# Patient Record
Sex: Female | Born: 1985 | Hispanic: No | Marital: Married | Smoking: Never smoker
Health system: Southern US, Community
[De-identification: ages and names within clinical notes are randomized; demographics above are authoritative.]

## PROBLEM LIST (undated history)

## (undated) DIAGNOSIS — F840 Autistic disorder: Secondary | ICD-10-CM

## (undated) DIAGNOSIS — F419 Anxiety disorder, unspecified: Secondary | ICD-10-CM

## (undated) DIAGNOSIS — F32A Depression, unspecified: Secondary | ICD-10-CM

## (undated) DIAGNOSIS — Z9889 Other specified postprocedural states: Secondary | ICD-10-CM

## (undated) DIAGNOSIS — R87629 Unspecified abnormal cytological findings in specimens from vagina: Secondary | ICD-10-CM

## (undated) DIAGNOSIS — IMO0001 Reserved for inherently not codable concepts without codable children: Secondary | ICD-10-CM

## (undated) DIAGNOSIS — N83209 Unspecified ovarian cyst, unspecified side: Secondary | ICD-10-CM

## (undated) DIAGNOSIS — F329 Major depressive disorder, single episode, unspecified: Secondary | ICD-10-CM

## (undated) DIAGNOSIS — T8859XA Other complications of anesthesia, initial encounter: Secondary | ICD-10-CM

## (undated) DIAGNOSIS — K219 Gastro-esophageal reflux disease without esophagitis: Secondary | ICD-10-CM

## (undated) DIAGNOSIS — R112 Nausea with vomiting, unspecified: Secondary | ICD-10-CM

## (undated) DIAGNOSIS — T4145XA Adverse effect of unspecified anesthetic, initial encounter: Secondary | ICD-10-CM

## (undated) DIAGNOSIS — J45909 Unspecified asthma, uncomplicated: Secondary | ICD-10-CM

## (undated) HISTORY — DX: Gastro-esophageal reflux disease without esophagitis: K21.9

## (undated) HISTORY — DX: Reserved for inherently not codable concepts without codable children: IMO0001

## (undated) HISTORY — PX: TONSILLECTOMY: SUR1361

## (undated) HISTORY — PX: COLPOSCOPY: SHX161

## (undated) HISTORY — DX: Depression, unspecified: F32.A

## (undated) HISTORY — DX: Anxiety disorder, unspecified: F41.9

## (undated) HISTORY — DX: Unspecified asthma, uncomplicated: J45.909

## (undated) HISTORY — PX: ABDOMINAL HYSTERECTOMY: SHX81

## (undated) HISTORY — PX: IMPLANTATION VAGAL NERVE STIMULATOR: SUR692

## (undated) HISTORY — DX: Autistic disorder: F84.0

## (undated) HISTORY — DX: Major depressive disorder, single episode, unspecified: F32.9

## (undated) HISTORY — DX: Unspecified ovarian cyst, unspecified side: N83.209

## (undated) HISTORY — DX: Unspecified abnormal cytological findings in specimens from vagina: R87.629

---

## 2012-07-03 ENCOUNTER — Encounter: Payer: Self-pay | Admitting: Obstetrics & Gynecology

## 2012-07-03 ENCOUNTER — Ambulatory Visit (INDEPENDENT_AMBULATORY_CARE_PROVIDER_SITE_OTHER): Payer: 59 | Admitting: Obstetrics & Gynecology

## 2012-07-03 VITALS — BP 114/77 | HR 109 | Ht 61.0 in | Wt 180.0 lb

## 2012-07-03 DIAGNOSIS — Z0000 Encounter for general adult medical examination without abnormal findings: Secondary | ICD-10-CM

## 2012-07-03 DIAGNOSIS — Z124 Encounter for screening for malignant neoplasm of cervix: Secondary | ICD-10-CM

## 2012-07-03 DIAGNOSIS — E282 Polycystic ovarian syndrome: Secondary | ICD-10-CM

## 2012-07-03 DIAGNOSIS — Z113 Encounter for screening for infections with a predominantly sexual mode of transmission: Secondary | ICD-10-CM

## 2012-07-03 NOTE — Progress Notes (Signed)
Subjective:    Jade Gates is a 26 y.o. female who presents for an annual exam. She has been trying to get pregnant since March but has been unsuccessful. She has not had a period since then either and no + on the ovulation prediction kits. She was told at [facility] that she probably has PCOS and she has been treated with 6 months of provera in the past for anovulation. She denies using Clomid/drugs to achieve pregnancy in the past. The patient is sexually active. GYN screening history: last pap: was normal. The patient wears seatbelts: yes. The patient participates in regular exercise: yes.( She has lost 10 # by working with the [employer] center at [facility]) Has the patient ever been transfused or tattooed?: yes. (tattoo) The patient reports that there is not domestic violence in her life.   Menstrual History: OB History    Grav Para Term Preterm Abortions TAB SAB Ect Mult Living   2 2 2  0 0 0 0 0 0 2      Menarche age: 26 Patient's last menstrual period was 01/23/2012.    The following portions of the patient's history were reviewed and updated as appropriate: allergies, current medications, past family history, past medical history, past social history, past surgical history and problem list.  Review of Systems A comprehensive review of systems was negative. She does report that a previous u/s showed "many cysts" around her ovaries. She reports unwanted hair growth. She says that her cholesterol and sugar are elevated but that her FP is following them. (they are decreasing as she loses weight)   Objective:    BP 114/77  Pulse 109  Ht 5\' 1"  (1.549 m)  Wt 81.647 kg (180 lb)  BMI 34.01 kg/m2  LMP 01/23/2012  General Appearance:    Alert, cooperative, no distress, appears stated age  Head:    Normocephalic, without obvious abnormality, atraumatic  Eyes:    PERRL, conjunctiva/corneas clear, EOM's intact, fundi    benign, both eyes  Ears:    Normal TM's and external ear canals,  both ears  Nose:   Nares normal, septum midline, mucosa normal, no drainage    or sinus tenderness  Throat:   Lips, mucosa, and tongue normal; teeth and gums normal  Neck:   Supple, symmetrical, trachea midline, no adenopathy;    thyroid:  no enlargement/tenderness/nodules; no carotid   bruit or JVD  Back:     Symmetric, no curvature, ROM normal, no CVA tenderness  Lungs:     Clear to auscultation bilaterally, respirations unlabored  Chest Wall:    No tenderness or deformity   Heart:    Regular rate and rhythm, S1 and S2 normal, no murmur, rub   or gallop  Breast Exam:    No tenderness, masses, or nipple abnormality  Abdomen:     Soft, non-tender, bowel sounds active all four quadrants,    no masses, no organomegaly, obese, shaved hair nearly all over  Genitalia:    Normal female without lesion, discharge or tenderness, NSSA, mobile, NT, no adnexal masses     Extremities:   Extremities normal, atraumatic, no cyanosis or edema  Pulses:   2+ and symmetric all extremities  Skin:   Skin color, texture, turgor normal, no rashes or lesions  Lymph nodes:   Cervical, supraclavicular, and axillary nodes normal  Neurologic:   CNII-XII intact, normal strength, sensation and reflexes    throughout  .    Assessment:    Healthy female exam.  Plan:     Pap smear. cyclic provera

## 2012-07-03 NOTE — Progress Notes (Signed)
Patient is here for GYN exam and has been trying to conceive unsuccessfully.

## 2012-07-08 ENCOUNTER — Telehealth: Payer: Self-pay | Admitting: *Deleted

## 2012-07-08 DIAGNOSIS — E282 Polycystic ovarian syndrome: Secondary | ICD-10-CM

## 2012-07-08 MED ORDER — MEDROXYPROGESTERONE ACETATE 10 MG PO TABS: 10.0000 mg | ORAL_TABLET | Freq: Every day | ORAL | Status: DC

## 2012-07-08 NOTE — Telephone Encounter (Signed)
Pt called and stated that her RX for Provera was not @ the pharmacy.  Dr Marice Potter had said her plan was to put pt on cyclic Provera and RX was now sent to [facility] for Provera 10x10.

## 2014-09-06 ENCOUNTER — Encounter: Payer: Self-pay | Admitting: Obstetrics & Gynecology

## 2018-07-28 ENCOUNTER — Ambulatory Visit (INDEPENDENT_AMBULATORY_CARE_PROVIDER_SITE_OTHER): Payer: BLUE CROSS/BLUE SHIELD | Admitting: Obstetrics & Gynecology

## 2018-07-28 ENCOUNTER — Encounter: Payer: Self-pay | Admitting: Obstetrics & Gynecology

## 2018-07-28 VITALS — BP 114/75 | HR 81 | Ht 61.0 in | Wt 179.0 lb

## 2018-07-28 DIAGNOSIS — Z23 Encounter for immunization: Secondary | ICD-10-CM

## 2018-07-28 DIAGNOSIS — N938 Other specified abnormal uterine and vaginal bleeding: Secondary | ICD-10-CM | POA: Diagnosis not present

## 2018-07-28 DIAGNOSIS — Z862 Personal history of diseases of the blood and blood-forming organs and certain disorders involving the immune mechanism: Secondary | ICD-10-CM | POA: Diagnosis not present

## 2018-07-28 DIAGNOSIS — R5383 Other fatigue: Secondary | ICD-10-CM

## 2018-07-28 NOTE — Progress Notes (Signed)
Pt c/o bleeding everyday   Pt states last pap smear was in 2015- normal results

## 2018-07-28 NOTE — Progress Notes (Signed)
Patient ID: Jade Gates, female   DOB: 07-02-1986, 32 y.o.   MRN: [id number]  Chief Complaint  Patient presents with  . Metrorrhagia    HPI Jade Gates is a 32 y.o. female married P3 (70, 46, 62 yo kids) here today with the issue of "constant bleeding". This started about 6 weeks ago. She started OCPs about 4 months ago (her Ob/Gyn PA at [facility]) for PMDD. She tried the brand Junel (1.5/30) versus generic but continued. The dose was then increased but the bleeding is still occurring, daily to QOD, spotting- a pad lasts all day. She is having severe cramping. She is taking IBU 400mg  prn. She reports that her PMDD is better with OCPs. However, when she has the spotting, she feels like her mood is worse again.  HPI  Past Medical History:  Diagnosis Date  . Anxiety   . Asthma   . Autism   . Depression   . Depression   . Ovarian cyst   . Reflux   . Vaginal Pap smear, abnormal     Past Surgical History:  Procedure Laterality Date  . COLPOSCOPY    . IMPLANTATION VAGAL NERVE STIMULATOR      Family History  Adopted: Yes    Social History Social History   Tobacco Use  . Smoking status: Never Smoker  . Smokeless tobacco: Never Used  Substance Use Topics  . Alcohol use: No  . Drug use: No    Allergies  Allergen Reactions  . Ciprofloxacin Shortness Of Breath  . Vortioxetine Other (See Comments)    Other  . Atorvastatin Other (See Comments), Nausea Only and Palpitations    Headache, fatigue   . Buspar [Buspirone] Palpitations    Current Outpatient Medications  Medication Sig Dispense Refill  . alprazolam (XANAX) 2 MG tablet Take by mouth.    . Brexpiprazole 4 MG TABS Take by mouth.    . desvenlafaxine (PRISTIQ) 50 MG 24 hr tablet Take 50 mg by mouth daily.    . meloxicam (MOBIC) 15 MG tablet Take by mouth.    . montelukast (SINGULAIR) 10 MG tablet Take by mouth.    . norethindrone-ethinyl estradiol (JUNEL FE,GILDESS FE,LOESTRIN FE) 1-20 MG-MCG tablet Take 1 tablet by  mouth daily.    Marland Kitchen omeprazole (PRILOSEC) 40 MG capsule Take 1 capsule (40 mg total) by mouth 1  times daily before meals.    . VENTOLIN HFA 108 (90 Base) MCG/ACT inhaler TAKE 2 PUFFS BY MOUTH EVERY 4 HOURS AS NEEDED  99  . zolpidem (AMBIEN) 10 MG tablet Take by mouth.    Marland Kitchen amitriptyline (ELAVIL) 50 MG tablet Take 50 mg by mouth at bedtime.    . gabapentin (NEURONTIN) 100 MG capsule Take 100 mg by mouth 2 (two) times daily.    Marland Kitchen iloperidone (FANAPT) 4 MG TABS Take 4 mg by mouth 2 (two) times daily.    . medroxyPROGESTERone (PROVERA) 10 MG tablet Take 1 tablet (10 mg total) by mouth daily. 10 tablet 3  . venlafaxine XR (EFFEXOR-XR) 37.5 MG 24 hr capsule Take 37.5 mg by mouth daily.    Marland Kitchen venlafaxine XR (EFFEXOR-XR) 75 MG 24 hr capsule Take 75 mg by mouth daily.     No current facility-administered medications for this visit.     Review of Systems Review of Systems TSH recently normal She had iron infusions last month She had a colonoscopy and endoscopy She gets Vitamin B12 injections monthly She had 2 vaginal deliveries and 1 cesarean section She  has had a BTL.  Blood pressure 114/75, pulse 81, height 5\' 1"  (1.549 m), weight 179 lb (81.2 kg).  Physical Exam Physical Exam  Breathing, conversing, and ambulating normally Well nourished, well hydrated White female, no apparent distress Cervix- no lesions, small amount of BRB Bimanual exam limited by body habitus, no gross abnormalities, moderate uterine mobility, minimal descensus  Data Reviewed   Assessment   irregular bleeding with OCPs Preventative care    Plan     Gyn u/s Annual at next visit TDAP today Vitamin D level Come back 2-3 weeks       Bryne Lindon C Enslee Bibbins 07/28/2018, 3:19 PM

## 2018-07-29 ENCOUNTER — Other Ambulatory Visit: Payer: Self-pay | Admitting: Obstetrics & Gynecology

## 2018-07-29 LAB — CBC
HEMATOCRIT: 41.2 % (ref 35.0–45.0)
HEMOGLOBIN: 13.7 g/dL (ref 11.7–15.5)
MCH: 29.3 pg (ref 27.0–33.0)
MCHC: 33.3 g/dL (ref 32.0–36.0)
MCV: 88 fL (ref 80.0–100.0)
MPV: 10.8 fL (ref 7.5–12.5)
Platelets: 298 10*3/uL (ref 140–400)
RBC: 4.68 10*6/uL (ref 3.80–5.10)
RDW: 15.8 % — AB (ref 11.0–15.0)
WBC: 9.6 10*3/uL (ref 3.8–10.8)

## 2018-07-29 LAB — VITAMIN D 25 HYDROXY (VIT D DEFICIENCY, FRACTURES): Vit D, 25-Hydroxy: 14 ng/mL — ABNORMAL LOW (ref 30–100)

## 2018-07-29 MED ORDER — VITAMIN D (ERGOCALCIFEROL) 1.25 MG (50000 UNIT) PO CAPS: 50000.0000 [IU] | ORAL_CAPSULE | ORAL | 0 refills | Status: DC

## 2018-07-29 NOTE — Progress Notes (Signed)
Vit D prescribed Patient notified via Mychart 

## 2018-07-30 ENCOUNTER — Ambulatory Visit (INDEPENDENT_AMBULATORY_CARE_PROVIDER_SITE_OTHER): Payer: BLUE CROSS/BLUE SHIELD

## 2018-07-30 DIAGNOSIS — N938 Other specified abnormal uterine and vaginal bleeding: Secondary | ICD-10-CM | POA: Diagnosis not present

## 2018-08-21 ENCOUNTER — Other Ambulatory Visit (HOSPITAL_COMMUNITY)
Admission: RE | Admit: 2018-08-21 | Discharge: 2018-08-21 | Disposition: A | Discharge status: Home / Self Care | Payer: BLUE CROSS/BLUE SHIELD | Source: Ambulatory Visit | Attending: Obstetrics & Gynecology | Admitting: Obstetrics & Gynecology

## 2018-08-21 ENCOUNTER — Encounter: Payer: Self-pay | Admitting: Obstetrics & Gynecology

## 2018-08-21 ENCOUNTER — Ambulatory Visit (INDEPENDENT_AMBULATORY_CARE_PROVIDER_SITE_OTHER): Payer: BLUE CROSS/BLUE SHIELD | Admitting: Obstetrics & Gynecology

## 2018-08-21 VITALS — BP 111/80 | HR 83 | Ht 61.0 in | Wt 183.0 lb

## 2018-08-21 DIAGNOSIS — Z01419 Encounter for gynecological examination (general) (routine) without abnormal findings: Secondary | ICD-10-CM | POA: Diagnosis present

## 2018-08-21 MED ORDER — MISOPROSTOL 200 MCG PO TABS: ORAL_TABLET | ORAL | 0 refills | Status: DC

## 2018-08-21 NOTE — Progress Notes (Signed)
Subjective:    Jade Gates is a 32 y.o. married P3 female who presents for an annual exam. She is still having irregular spotting. She is still taking OCPs.  The patient is not currently sexually active for about 6 month due to her psych meds decreasing her libido.GYN screening history: last pap: was normal. The patient wears seatbelts: yes. The patient participates in regular exercise: yes. Has the patient ever been transfused or tattooed?: yes. The patient reports that there is not domestic violence in her life.   Menstrual History: OB History    Gravida  3   Para  3   Term  0   Preterm  3   AB  0   Living  3     SAB  0   TAB  0   Ectopic  0   Multiple  0   Live Births              Menarche age: 49 No LMP recorded.    The following portions of the patient's history were reviewed and updated as appropriate: allergies, current medications, past family history, past medical history, past social history, past surgical history and problem list.  Review of Systems Pertinent items are noted in HPI.   TSH normal at another doc's office this year. Married for 15 years Homemaker Got flu vaccine recently Fh- adopted Husband had a vasectomy Has to use IUI for her pregnancy She sees a Engineer, civil (consulting) and gets iron infusions periodically    Objective:    BP 111/80   Pulse 83   Ht 5\' 1"  (1.549 m)   Wt 183 lb (83 kg)   BMI 34.58 kg/m   General Appearance:    Alert, cooperative, no distress, appears stated age  Head:    Normocephalic, without obvious abnormality, atraumatic  Eyes:    PERRL, conjunctiva/corneas clear, EOM's intact, fundi    benign, both eyes  Ears:    Normal TM's and external ear canals, both ears  Nose:   Nares normal, septum midline, mucosa normal, no drainage    or sinus tenderness  Throat:   Lips, mucosa, and tongue normal; teeth and gums normal  Neck:   Supple, symmetrical, trachea midline, no adenopathy;    thyroid:  no  enlargement/tenderness/nodules; no carotid   bruit or JVD  Back:     Symmetric, no curvature, ROM normal, no CVA tenderness  Lungs:     Clear to auscultation bilaterally, respirations unlabored  Chest Wall:    No tenderness or deformity   Heart:    Regular rate and rhythm, S1 and S2 normal, no murmur, rub   or gallop  Breast Exam:    No tenderness, masses, or nipple abnormality  Abdomen:     Soft, non-tender, bowel sounds active all four quadrants,    no masses, no organomegaly  Genitalia:    Normal female without lesion, discharge or tenderness, normal size and shape, anteverted, mobile, non-tender, normal adnexal exam      Extremities:   Extremities normal, atraumatic, no cyanosis or edema  Pulses:   2+ and symmetric all extremities  Skin:   Skin color, texture, turgor normal, no rashes or lesions  Lymph nodes:   Cervical, supraclavicular, and axillary nodes normal  Neurologic:   CNII-XII intact, normal strength, sensation and reflexes    throughout  .    Assessment:    Healthy female exam.   DUB, possible adenomyosis   Plan:     Thin prep Pap  smear. with cotesting Schedule EMBX, pretreat with cytotec Cervical cultures If EMBX negative, consider ablation

## 2018-08-21 NOTE — Progress Notes (Signed)
Last pap- 07/03/12- normal

## 2018-08-26 LAB — CYTOLOGY - PAP
CHLAMYDIA, DNA PROBE: NEGATIVE
Diagnosis: NEGATIVE
HPV: NOT DETECTED
Neisseria Gonorrhea: NEGATIVE

## 2018-09-08 ENCOUNTER — Other Ambulatory Visit: Payer: BLUE CROSS/BLUE SHIELD | Admitting: Obstetrics & Gynecology

## 2018-09-11 ENCOUNTER — Other Ambulatory Visit: Payer: BLUE CROSS/BLUE SHIELD | Admitting: Obstetrics & Gynecology

## 2018-09-18 ENCOUNTER — Ambulatory Visit (INDEPENDENT_AMBULATORY_CARE_PROVIDER_SITE_OTHER): Payer: BLUE CROSS/BLUE SHIELD | Admitting: Obstetrics & Gynecology

## 2018-09-18 ENCOUNTER — Encounter (HOSPITAL_COMMUNITY): Payer: Self-pay

## 2018-09-18 ENCOUNTER — Encounter: Payer: Self-pay | Admitting: Obstetrics & Gynecology

## 2018-09-18 VITALS — BP 109/73 | HR 97 | Resp 16 | Ht 61.0 in | Wt 190.0 lb

## 2018-09-18 DIAGNOSIS — Z98891 History of uterine scar from previous surgery: Secondary | ICD-10-CM | POA: Insufficient documentation

## 2018-09-18 DIAGNOSIS — Z3202 Encounter for pregnancy test, result negative: Secondary | ICD-10-CM

## 2018-09-18 DIAGNOSIS — R102 Pelvic and perineal pain: Secondary | ICD-10-CM | POA: Diagnosis not present

## 2018-09-18 DIAGNOSIS — F319 Bipolar disorder, unspecified: Secondary | ICD-10-CM | POA: Diagnosis not present

## 2018-09-18 DIAGNOSIS — F840 Autistic disorder: Secondary | ICD-10-CM | POA: Diagnosis not present

## 2018-09-18 DIAGNOSIS — G8929 Other chronic pain: Secondary | ICD-10-CM

## 2018-09-18 DIAGNOSIS — N939 Abnormal uterine and vaginal bleeding, unspecified: Secondary | ICD-10-CM | POA: Insufficient documentation

## 2018-09-18 NOTE — Addendum Note (Signed)
Addended by: Lavonia Drafts on: 09/18/2018 03:13 PM   Modules accepted: Level of Service

## 2018-09-18 NOTE — Patient Instructions (Signed)
Hysterectomy Information A hysterectomy is a surgery to remove your uterus. After surgery, you will no longer have periods. Also, you will not be able to get pregnant. Reasons for this surgery  You have bleeding that is not normal and keeps coming back.  You have lasting (chronic) lower belly (pelvic) pain.  You have a lasting infection.  The lining of your uterus grows outside your uterus.  The lining of your uterus grows in the muscle of your uterus.  Your uterus falls down into your vagina.  You have a growth in your uterus that causes problems.  You have cells that could turn into cancer (precancerous cells).  You have cancer of the uterus or cervix. Types There are 3 types of hysterectomies. Depending on the type, the surgery will:  Remove the top part of the uterus only.  Remove the uterus and the cervix.  Remove the uterus, cervix, and tissue that holds the uterus in place in the lower belly.  Ways a hysterectomy can be performed There are 5 ways this surgery can be performed.  A cut (incision) is made in the belly (abdomen). The uterus is taken out through the cut.  A cut is made in the vagina. The uterus is taken out through the cut.  Three or four cuts are made in the belly. A surgical device with a camera is put through one of the cuts. The uterus is cut into small pieces. The uterus is taken out through the cuts or the vagina.  Three or four cuts are made in the belly. A surgical device with a camera is put through one of the cuts. The uterus is taken out through the vagina.  Three or four cuts are made in the belly. A surgical device that is controlled by a computer makes a visual image. The device helps the surgeon control the surgical tools. The uterus is cut into small pieces. The pieces are taken out through the cuts or through the vagina.  What can I expect after the surgery?  You will be given pain medicine.  You will need help at home for 3-5 days  after surgery.  You will need to see your doctor in 2-4 weeks after surgery.  You may get hot flashes, have night sweats, and have trouble sleeping.  You may need to have Pap tests in the future if your surgery was related to cancer. Talk to your doctor. It is still good to have regular exams. This information is not intended to replace advice given to you by your health care provider. Make sure you discuss any questions you have with your health care provider. Document Released: 01/14/2012 Document Revised: 03/29/2016 Document Reviewed: 06/29/2013 Elsevier Interactive Patient Education  2018 Elsevier Inc. Total Laparoscopic Hysterectomy, Care After Refer to this sheet in the next few weeks. These instructions provide you with information on caring for yourself after your procedure. Your health care provider may also give you more specific instructions. Your treatment has been planned according to current medical practices, but problems sometimes occur. Call your health care provider if you have any problems or questions after your procedure. What can I expect after the procedure?  Pain and bruising at the incision sites. You will be given pain medicine to control it.  Menopausal symptoms such as hot flashes, night sweats, and insomnia if your ovaries were removed.  Sore throat from the breathing tube that was inserted during surgery. Follow these instructions at home:  Only take over-the-counter or   prescription medicines for pain, discomfort, or fever as directed by your health care provider.  Do not take aspirin. It can cause bleeding.  Do not drive when taking pain medicine.  Follow your health care provider's advice regarding diet, exercise, lifting, driving, and general activities.  Resume your usual diet as directed and allowed.  Get plenty of rest and sleep.  Do not douche, use tampons, or have sexual intercourse for at least 6 weeks, or until your health care provider gives  you permission.  Change your bandages (dressings) as directed by your health care provider.  Monitor your temperature and notify your health care provider of a fever.  Take showers instead of baths for 2-3 weeks.  Do not drink alcohol until your health care provider gives you permission.  If you develop constipation, you may take a mild laxative with your health care provider's permission. Bran foods may help with constipation problems. Drinking enough fluids to keep your urine clear or pale yellow may help as well.  Try to have someone home with you for 1-2 weeks to help around the house.  Keep all of your follow-up appointments as directed by your health care provider. Contact a health care provider if:  You have swelling, redness, or increasing pain around your incision sites.  You have pus coming from your incision.  You notice a bad smell coming from your incision.  Your incision breaks open.  You feel dizzy or lightheaded.  You have pain or bleeding when you urinate.  You have persistent diarrhea.  You have persistent nausea and vomiting.  You have abnormal vaginal discharge.  You have a rash.  You have any type of abnormal reaction or develop an allergy to your medicine.  You have poor pain control with your prescribed medicine. Get help right away if:  You have chest pain or shortness of breath.  You have severe abdominal pain that is not relieved with pain medicine.  You have pain or swelling in your legs. This information is not intended to replace advice given to you by your health care provider. Make sure you discuss any questions you have with your health care provider. Document Released: 08/12/2013 Document Revised: 03/29/2016 Document Reviewed: 05/12/2013 Elsevier Interactive Patient Education  2017 Elsevier Inc.  

## 2018-09-18 NOTE — Progress Notes (Signed)
History:  32 y.o. [id number] here today for further eval of AUB and pelvic pain. Pt is s/p SVD x2 and c/s x1. Pt has tired OCPs and the LnIUD with no relief of the pain. Pt bleeding has led to Iron infusions x2. She has never had a blood transfusion.   The following portions of the patient's history were reviewed and updated as appropriate: allergies, current medications, past family history, past medical history, past social history, past surgical history and problem list.  Review of Systems:  Pertinent items are noted in HPI.    Objective:  Physical Exam Blood pressure 109/73, pulse 97, resp. rate 16, height 5\' 1"  (7.035 m), weight 190 lb (86.2 kg), last menstrual period 08/21/2018.  CONSTITUTIONAL: Well-developed, well-nourished female in no acute distress.  HENT:  Normocephalic, atraumatic EYES: Conjunctivae and EOM are normal. No scleral icterus.  NECK: Normal range of motion SKIN: Skin is warm and dry. No rash noted. Not diaphoretic.No pallor. [place]: Alert and oriented to person, place, and time. Normal coordination.  Abd: soft, NT, ND. Well helaed low transverse incision GYN:  EGBUS: no lesions Vagina: no blood in vault Cervix: no lesion; no mucopurulent d/c Uterus: small, mobile; good decensus Adnexa: no masses; non tender   Labs and Imaging 07/30/2018 CLINICAL DATA:  Dysfunctional uterine bleeding  EXAM: ULTRASOUND PELVIS TRANSVAGINAL  TECHNIQUE: Transvaginal ultrasound examination of the pelvis was performed including evaluation of the uterus, ovaries, adnexal regions, and pelvic cul-de-sac.  COMPARISON:  None.  FINDINGS: Uterus  Measurements: 8.6 x 4.2 x 4.9 cm. Heterogeneous echotexture likely reflects adenomyosis  Endometrium  Thickness: 4 mm in thickness.  No focal abnormality visualized.  Right ovary  Measurements: 3.2 x 2.1 x 2.9 cm. Normal appearance/no adnexal mass.  Left ovary  Measurements: 2.8 x 1.6 x 3.0 cm. Normal appearance/no  adnexal mass.  Other findings:  No abnormal free fluid  IMPRESSION: Heterogeneous echotexture throughout the uterus without well-defined focal fibroid. This likely reflects adenomyosis.  Assessment & Plan:  Pt with chronic pelvic pain and AUB. Sx not resolved with  OCPs or LnIUD. Her endometrial stirp is 3mm which make s endo ca highly unlikely at here age. The [place] is suggestive of adenomyosis which is consistent with her sx. Pt requests definitive tx with a hyst. I have counseled her that an ablation on may cause increased pain.    Patient desires surgical management with [facility] with bilateral salpingectomy.  The risks of surgery were discussed in detail with the patient including but not limited to: bleeding which may require transfusion or reoperation; infection which may require prolonged hospitalization or re-hospitalization and antibiotic therapy; injury to bowel, bladder, ureters and major vessels or other surrounding organs; need for additional procedures including laparotomy; thromboembolic phenomenon, incisional problems and other postoperative or anesthesia complications.  Patient was told that the likelihood that her condition and symptoms will be treated effectively with this surgical management was very high; the postoperative expectations were also discussed in detail. The patient also understands the alternative treatment options which were discussed in full. All questions were answered.  She was told that she will be contacted by our surgical scheduler regarding the time and date of her surgery; routine preoperative instructions of having nothing to eat or drink after midnight on the day prior to surgery and also coming to the hospital 1 1/2 hours prior to her time of surgery were also emphasized.  She was told she may be called for a preoperative appointment about a week prior to  surgery and will be given further preoperative instructions at that visit. Printed patient education handouts  about the procedure were given to the patient to review at home.  Total face-to-face time with patient was 65min.  Greater than 50% was spent in counseling and coordination of care with the patient.   Bethzaida Boord L. Harraway-Smith, M.D., Cherlynn June

## 2018-09-19 HISTORY — PX: ANKLE RECONSTRUCTION: SHX1151

## 2018-09-22 ENCOUNTER — Telehealth: Payer: Self-pay | Admitting: *Deleted

## 2018-09-22 NOTE — Telephone Encounter (Addendum)
Pt left message stating that she is scheduled for surgery on 12/3 and wants to know if she can still have the surgery since she has a cast on her leg. Pt had an injury to her leg which required ligament repair surgery. The cast goes from her foot to just below the knee. The cast will remain on for 6-8 wks. She is not weight bearing at this time and is using a wheelchair. Pt states that her doctor (Dr. Nicki Reaper @ [facility]) said he has no problems with her having the surgery as planned. I advised pt that there are multiple factors to consider since she will not be able to physically move about after the surgery. Pt states she really wants to have the surgery before the end of the calendar year due to her insurance. I will send a message to Dr. Ihor Dow with this information. Once a response is received, pt will receive MyChart message. I also requested pt to obtain a formal letter from Dr. Nicki Reaper stating his clearance for pt to proceed with gynecology surgery. Pt voiced understanding of all information given.   11/19 Pt has sent a MyChart message stating that her orthopedic surgeon is going to fax a letter to our office stating that he has no problem with her having surgery on 12/3. She will be able to use a knee scooter and therefore be able to move about after surgery. Pt has been informed via MyChart message that Dr. Ihor Dow is on vacation until 11/25. She will be notified once a response is received.

## 2018-09-23 ENCOUNTER — Encounter: Payer: Self-pay | Admitting: *Deleted

## 2018-09-25 ENCOUNTER — Encounter: Payer: Self-pay | Admitting: *Deleted

## 2018-09-29 ENCOUNTER — Encounter (HOSPITAL_COMMUNITY): Payer: Self-pay

## 2018-09-29 ENCOUNTER — Telehealth: Payer: Self-pay | Admitting: Obstetrics & Gynecology

## 2018-09-29 NOTE — Telephone Encounter (Signed)
TC to pt. Her ortho doc sent a note that she cannot be placed in stirrups during the procedure. There was no answer so I left a msg for her to call [facility], [phone].  Her case will need to be delayed by 6 weeks.  clh-S

## 2018-10-01 ENCOUNTER — Other Ambulatory Visit (HOSPITAL_COMMUNITY): Payer: BLUE CROSS/BLUE SHIELD

## 2018-10-13 ENCOUNTER — Other Ambulatory Visit: Payer: Self-pay | Admitting: Obstetrics & Gynecology

## 2018-10-16 ENCOUNTER — Telehealth: Payer: Self-pay | Admitting: *Deleted

## 2018-10-16 MED ORDER — NORETHINDRONE ACET-ETHINYL EST 1.5-30 MG-MCG PO TABS: 1.0000 | ORAL_TABLET | Freq: Every day | ORAL | 1 refills | Status: DC

## 2018-10-16 NOTE — Telephone Encounter (Signed)
Pt called requesting a RF on her OCP's because her husband accidentally threw away her pack of pills.  She is having surgery by Dr Ihor Dow in Jan 2020.

## 2018-10-27 NOTE — Patient Instructions (Addendum)
Jade Gates  10/27/2018   Your procedure is scheduled on: 11-11-18   Report to [facility] Main  Entrance    Report to Admitting at 5:30  AM    Call this number if you have problems the morning of surgery [phone]     Remember: Do not eat food or drink liquids :After Midnight. NO SOLID FOOD AFTER MIDNIGHT THE NIGHT PRIOR TO SURGERY. NOTHING BY MOUTH EXCEPT CLEAR LIQUIDS. PLEASE FINISH ENSURE DRINK PER SURGEON ORDER WHICH NEEDS TO BE COMPLETED AT 4:30 AM. AFTER 4:30 AM, NOTHING UNTIL AFTER SURGERY.  BRUSH YOUR TEETH MORNING OF SURGERY AND RINSE YOUR MOUTH OUT, NO CHEWING GUM CANDY OR MINTS.     CLEAR LIQUID DIET   Foods Allowed                                                                     Foods Excluded  Coffee and tea, regular and decaf                             liquids that you cannot  Plain Jell-O in any flavor                                             see through such as: Fruit ices (not with fruit pulp)                                     milk, soups, orange juice  Iced Popsicles                                    All solid food Carbonated beverages, regular and diet                                    Cranberry, grape and apple juices Sports drinks like Gatorade Lightly seasoned clear broth or consume(fat free) Sugar, honey syrup  Sample Menu Breakfast                                Lunch                                     Supper Cranberry juice                    Beef broth                            Chicken broth Jell-O  Grape juice                           Apple juice Coffee or tea                        Jell-O                                      Popsicle                                                Coffee or tea                        Coffee or tea  _____________________________________________________________________     Take these medicines the morning of surgery with A SIP OF WATER:  None-Per pt's preference                               You may not have any metal on your body including hair pins and              piercings  Do not wear jewelry, make-up, lotions, powders or perfumes, deodorant             Do not wear nail polish.  Do not shave  48 hours prior to surgery.               Do not bring valuables to the hospital. [place].  Contacts, dentures or bridgework may not be worn into surgery.  Leave suitcase in the car. After surgery it may be brought to your room.      Special Instructions: N/A              Please read over the following fact sheets you were given: _____________________________________________________________________             [facility] - Preparing for Surgery Before surgery, you can play an important role.  Because skin is not sterile, your skin needs to be as free of germs as possible.  You can reduce the number of germs on your skin by washing with CHG (chlorahexidine gluconate) soap before surgery.  CHG is an antiseptic cleaner which kills germs and bonds with the skin to continue killing germs even after washing. Please DO NOT use if you have an allergy to CHG or antibacterial soaps.  If your skin becomes reddened/irritated stop using the CHG and inform your nurse when you arrive at Short Stay. Do not shave (including legs and underarms) for at least 48 hours prior to the first CHG shower.  You may shave your face/neck. Please follow these instructions carefully:  1.  Shower with CHG Soap the night before surgery and the  morning of Surgery.  2.  If you choose to wash your hair, wash your hair first as usual with your  normal  shampoo.  3.  After you shampoo, rinse your hair and body thoroughly to remove the  shampoo.  4.  Use CHG as you would any other liquid soap.  You can apply chg directly  to the skin and wash                       Gently with a scrungie or  clean washcloth.  5.  Apply the CHG Soap to your body ONLY FROM THE NECK DOWN.   Do not use on face/ open                           Wound or open sores. Avoid contact with eyes, ears mouth and genitals (private parts).                       Wash face,  Genitals (private parts) with your normal soap.             6.  Wash thoroughly, paying special attention to the area where your surgery  will be performed.  7.  Thoroughly rinse your body with warm water from the neck down.  8.  DO NOT shower/wash with your normal soap after using and rinsing off  the CHG Soap.                9.  Pat yourself dry with a clean towel.            10.  Wear clean pajamas.            11.  Place clean sheets on your bed the night of your first shower and do not  sleep with pets. Day of Surgery : Do not apply any lotions/deodorants the morning of surgery.  Please wear clean clothes to the hospital/surgery center.  FAILURE TO FOLLOW THESE INSTRUCTIONS MAY RESULT IN THE CANCELLATION OF YOUR SURGERY PATIENT SIGNATURE_________________________________  NURSE SIGNATURE__________________________________  ________________________________________________________________________  WHAT IS A BLOOD TRANSFUSION? Blood Transfusion Information  A transfusion is the replacement of blood or some of its parts. Blood is made up of multiple cells which provide different functions.  Red blood cells carry oxygen and are used for blood loss replacement.  White blood cells fight against infection.  Platelets control bleeding.  Plasma helps clot blood.  Other blood products are available for specialized needs, such as hemophilia or other clotting disorders. BEFORE THE TRANSFUSION  Who gives blood for transfusions?   Healthy volunteers who are fully evaluated to make sure their blood is safe. This is blood bank blood. Transfusion therapy is the safest it has ever been in the practice of medicine. Before blood is taken from a  donor, a complete history is taken to make sure that person has no history of diseases nor engages in risky social behavior (examples are intravenous drug use or sexual activity with multiple partners). The donor's travel history is screened to minimize risk of transmitting infections, such as malaria. The donated blood is tested for signs of infectious diseases, such as HIV and hepatitis. The blood is then tested to be sure it is compatible with you in order to minimize the chance of a transfusion reaction. If you or a relative donates blood, this is often done in anticipation of surgery and is not appropriate for emergency situations. It takes many days to process the donated blood. RISKS AND COMPLICATIONS Although transfusion therapy is very safe and saves many lives, the main dangers of transfusion include:   Getting an infectious disease.  Developing a transfusion reaction.  This is an allergic reaction to something in the blood you were given. Every precaution is taken to prevent this. The decision to have a blood transfusion has been considered carefully by your caregiver before blood is given. Blood is not given unless the benefits outweigh the risks. AFTER THE TRANSFUSION  Right after receiving a blood transfusion, you will usually feel much better and more energetic. This is especially true if your red blood cells have gotten low (anemic). The transfusion raises the level of the red blood cells which carry oxygen, and this usually causes an energy increase.  The nurse administering the transfusion will monitor you carefully for complications. HOME CARE INSTRUCTIONS  No special instructions are needed after a transfusion. You may find your energy is better. Speak with your caregiver about any limitations on activity for underlying diseases you may have. SEEK MEDICAL CARE IF:   Your condition is not improving after your transfusion.  You develop redness or irritation at the intravenous (IV)  site. SEEK IMMEDIATE MEDICAL CARE IF:  Any of the following symptoms occur over the next 12 hours:  Shaking chills.  You have a temperature by mouth above 102 F (38.9 C), not controlled by medicine.  Chest, back, or muscle pain.  People around you feel you are not acting correctly or are confused.  Shortness of breath or difficulty breathing.  Dizziness and fainting.  You get a rash or develop hives.  You have a decrease in urine output.  Your urine turns a dark color or changes to pink, red, or brown. Any of the following symptoms occur over the next 10 days:  You have a temperature by mouth above 102 F (38.9 C), not controlled by medicine.  Shortness of breath.  Weakness after normal activity.  The white part of the eye turns yellow (jaundice).  You have a decrease in the amount of urine or are urinating less often.  Your urine turns a dark color or changes to pink, red, or brown. Document Released: 10/19/2000 Document Revised: 01/14/2012 Document Reviewed: 06/07/2008 [facility] Patient Information 2014 [place], [place].  _______________________________________________________________________

## 2018-11-04 ENCOUNTER — Encounter (HOSPITAL_COMMUNITY): Payer: Self-pay

## 2018-11-04 ENCOUNTER — Other Ambulatory Visit: Payer: Self-pay

## 2018-11-04 ENCOUNTER — Encounter (HOSPITAL_COMMUNITY)
Admission: RE | Admit: 2018-11-04 | Discharge: 2018-11-04 | Disposition: A | Discharge status: Home / Self Care | Payer: BLUE CROSS/BLUE SHIELD | Source: Ambulatory Visit | Attending: Obstetrics & Gynecology | Admitting: Obstetrics & Gynecology

## 2018-11-04 DIAGNOSIS — Z01812 Encounter for preprocedural laboratory examination: Secondary | ICD-10-CM | POA: Insufficient documentation

## 2018-11-04 HISTORY — DX: Other complications of anesthesia, initial encounter: T88.59XA

## 2018-11-04 HISTORY — DX: Nausea with vomiting, unspecified: R11.2

## 2018-11-04 HISTORY — DX: Adverse effect of unspecified anesthetic, initial encounter: T41.45XA

## 2018-11-04 HISTORY — DX: Other specified postprocedural states: Z98.890

## 2018-11-04 LAB — CBC
HEMATOCRIT: 39.6 % (ref 36.0–46.0)
HEMOGLOBIN: 12.8 g/dL (ref 12.0–15.0)
MCH: 31 pg (ref 26.0–34.0)
MCHC: 32.3 g/dL (ref 30.0–36.0)
MCV: 95.9 fL (ref 80.0–100.0)
Platelets: 296 10*3/uL (ref 150–400)
RBC: 4.13 MIL/uL (ref 3.87–5.11)
RDW: 11.8 % (ref 11.5–15.5)
WBC: 6.9 10*3/uL (ref 4.0–10.5)
nRBC: 0 % (ref 0.0–0.2)

## 2018-11-04 LAB — HCG, SERUM, QUALITATIVE: PREG SERUM: NEGATIVE

## 2018-11-04 NOTE — Progress Notes (Signed)
Pt indicated that she "always" takes her medication when she has ECT,after the procedure. As a results, she prefers not to take any of her medication (including the antidepressant/antipsychotic medication) until after her surgical procedure .Marland KitchenMarland KitchenPt stated that she was unable to urinate to provide a urine sample. Therefore, serum pregnancy test ordered.

## 2018-11-05 ENCOUNTER — Other Ambulatory Visit: Payer: Self-pay | Admitting: Obstetrics & Gynecology

## 2018-11-05 LAB — ABO/RH: ABO/RH(D): A NEG

## 2018-11-10 NOTE — H&P (Signed)
Preoperative History and Physical  Jade Gates is a 33 y.o. [phone] here for surgical management of AUB and chronic pelvic pain not managed well with conservative measures- suspected adenomyosis.   Proposed surgery: Robot assisted total laparoscopic hysterectomy with bilateral salpingectomy  Past Medical History:  Diagnosis Date  . Anxiety   . Asthma   . Autism   . Complication of anesthesia   . Depression   . Depression   . Ovarian cyst   . PONV (postoperative nausea and vomiting)   . Reflux   . Vaginal Pap smear, abnormal    Past Surgical History:  Procedure Laterality Date  . ANKLE RECONSTRUCTION Left 09/19/2018  . COLPOSCOPY    . IMPLANTATION VAGAL NERVE STIMULATOR    . TONSILLECTOMY     OB History    Gravida  3   Para  3   Term  0   Preterm  3   AB  0   Living  3     SAB  0   TAB  0   Ectopic  0   Multiple  0   Live Births             Patient denies any cervical dysplasia or STIs. No medications prior to admission.    Allergies  Allergen Reactions  . Ciprofloxacin Shortness Of Breath  . Vortioxetine Other (See Comments)    Other  . Atorvastatin Other (See Comments), Nausea Only and Palpitations    Headache, fatigue   . Buspar [Buspirone] Palpitations   Social History:   reports that she has never smoked. She has never used smokeless tobacco. She reports that she does not drink alcohol or use drugs. Family History  Adopted: Yes    Review of Systems: Noncontributory  PHYSICAL EXAM: There were no vitals taken for this visit. General appearance - alert, well appearing, and in no distress Chest - clear to auscultation, no wheezes, rales or rhonchi, symmetric air entry Heart - normal rate and regular rhythm Abdomen - soft, nontender, nondistended, no masses or organomegaly Pelvic - examination not indicated Extremities - peripheral pulses normal, no pedal edema, no clubbing or cyanosis  Labs: Results for orders placed or performed  during the hospital encounter of 11/04/18 (from the past 336 hour(s))  ABO/Rh   Collection Time: 11/04/18  3:47 PM  Result Value Ref Range   ABO/RH(D)      A NEG Performed at [facility] [address], [place], [place] [address]   CBC   Collection Time: 11/04/18  3:48 PM  Result Value Ref Range   WBC 6.9 4.0 - 10.5 K/uL   RBC 4.13 3.87 - 5.11 MIL/uL   Hemoglobin 12.8 12.0 - 15.0 g/dL   HCT 39.6 36.0 - 46.0 %   MCV 95.9 80.0 - 100.0 fL   MCH 31.0 26.0 - 34.0 pg   MCHC 32.3 30.0 - 36.0 g/dL   RDW 11.8 11.5 - 15.5 %   Platelets 296 150 - 400 K/uL   nRBC 0.0 0.0 - 0.2 %  hCG, serum, qualitative (Not at [facility])   Collection Time: 11/04/18  3:48 PM  Result Value Ref Range   Preg, Serum NEGATIVE NEGATIVE  Type and screen [place]   Collection Time: 11/04/18  3:48 PM  Result Value Ref Range   ABO/RH(D) A NEG    Antibody Screen NEG    Sample Expiration 11/18/2018    Extend sample reason      NO TRANSFUSIONS  OR PREGNANCY IN THE PAST 3 MONTHS Performed at [facility] [address], [place], [place] [address]     Imaging Studies: 07/30/2018 CLINICAL DATA:  Dysfunctional uterine bleeding  EXAM: ULTRASOUND PELVIS TRANSVAGINAL  TECHNIQUE: Transvaginal ultrasound examination of the pelvis was performed including evaluation of the uterus, ovaries, adnexal regions, and pelvic cul-de-sac.  COMPARISON:  None.  FINDINGS: Uterus  Measurements: 8.6 x 4.2 x 4.9 cm. Heterogeneous echotexture likely reflects adenomyosis  Endometrium  Thickness: 4 mm in thickness.  No focal abnormality visualized.  Right ovary  Measurements: 3.2 x 2.1 x 2.9 cm. Normal appearance/no adnexal mass.  Left ovary  Measurements: 2.8 x 1.6 x 3.0 cm. Normal appearance/no adnexal mass.  Other findings:  No abnormal free fluid  IMPRESSION: Heterogeneous echotexture throughout the uterus without well-defined focal fibroid. This  likely reflects adenomyosis.  Assessment: Patient Active Problem List   Diagnosis Date Noted  . Chronic female pelvic pain 09/18/2018  . Abnormal uterine bleeding (AUB) 09/18/2018  . Previous cesarean section 09/18/2018  . Bipolar disease, chronic ([place]) 09/18/2018  . Autism disorder 09/18/2018    Plan: Patient will undergo surgical management with Robot assisted total laparoscopic hysterectomy with bilateral salpingectomy.   The risks of surgery were discussed in detail with the patient including but not limited to: bleeding which may require transfusion or reoperation; infection which may require antibiotics; injury to surrounding organs which may involve bowel, bladder, ureters ; need for additional procedures including laparoscopy or laparotomy; thromboembolic phenomenon, surgical site problems and other postoperative/anesthesia complications. Likelihood of success in alleviating the patient's condition was discussed. Routine postoperative instructions will be reviewed with the patient and her family in detail after surgery.  The patient concurred with the proposed plan, giving informed written consent for the surgery.  Patient has been NPO since last night she will remain NPO for procedure.  Anesthesia and OR aware.  Preoperative prophylactic antibiotics and SCDs ordered on call to the OR.  To OR when ready.  Daijah Scrivens L. Ihor Dow, M.D., [facility] 11/10/2018 4:41 PM

## 2018-11-10 NOTE — Anesthesia Preprocedure Evaluation (Addendum)
Anesthesia Evaluation  Patient identified by MRN, date of birth, ID band Patient awake    Reviewed: Allergy & Precautions, NPO status , Patient's Chart, lab work & pertinent test results  History of Anesthesia Complications (+) PONV and history of anesthetic complications  Airway Mallampati: III  TM Distance: >3 FB Neck ROM: Full    Dental no notable dental hx. (+) Dental Advisory Given   Pulmonary asthma ,    Pulmonary exam normal        Cardiovascular negative cardio ROS Normal cardiovascular exam     Neuro/Psych PSYCHIATRIC DISORDERS Anxiety Depression Bipolar Disorder negative neurological ROS     GI/Hepatic negative GI ROS, Neg liver ROS,   Endo/Other  Morbid obesity  Renal/GU negative Renal ROS     Musculoskeletal negative musculoskeletal ROS (+)   Abdominal   Peds negative pediatric ROS (+)  Hematology negative hematology ROS (+)   Anesthesia Other Findings Day of surgery medications reviewed with the patient.  Reproductive/Obstetrics                            Anesthesia Physical Anesthesia Plan  ASA: II  Anesthesia Plan: General   Post-op Pain Management:    Induction: Intravenous  PONV Risk Score and Plan: 4 or greater and Ondansetron, Dexamethasone, Scopolamine patch - Pre-op and Diphenhydramine  Airway Management Planned: Oral ETT  Additional Equipment:   Intra-op Plan:   Post-operative Plan: Extubation in OR  Informed Consent:   Plan Discussed with:   Anesthesia Plan Comments:         Anesthesia Quick Evaluation

## 2018-11-11 ENCOUNTER — Ambulatory Visit (HOSPITAL_BASED_OUTPATIENT_CLINIC_OR_DEPARTMENT_OTHER)
Admission: RE | Admit: 2018-11-11 | Discharge: 2018-11-11 | Disposition: A | Discharge status: Home / Self Care | Payer: BLUE CROSS/BLUE SHIELD | Source: Ambulatory Visit | Attending: Obstetrics & Gynecology | Admitting: Obstetrics & Gynecology

## 2018-11-11 ENCOUNTER — Encounter (HOSPITAL_COMMUNITY)
Admission: RE | Disposition: A | Discharge status: Home / Self Care | Payer: Self-pay | Source: Ambulatory Visit | Attending: Obstetrics & Gynecology

## 2018-11-11 ENCOUNTER — Encounter (HOSPITAL_COMMUNITY): Payer: Self-pay | Admitting: *Deleted

## 2018-11-11 ENCOUNTER — Observation Stay (HOSPITAL_COMMUNITY): Payer: BLUE CROSS/BLUE SHIELD | Admitting: Physician Assistant

## 2018-11-11 ENCOUNTER — Observation Stay (HOSPITAL_BASED_OUTPATIENT_CLINIC_OR_DEPARTMENT_OTHER): Payer: BLUE CROSS/BLUE SHIELD | Admitting: Physician Assistant

## 2018-11-11 DIAGNOSIS — D259 Leiomyoma of uterus, unspecified: Secondary | ICD-10-CM | POA: Insufficient documentation

## 2018-11-11 DIAGNOSIS — F840 Autistic disorder: Secondary | ICD-10-CM | POA: Diagnosis not present

## 2018-11-11 DIAGNOSIS — R102 Pelvic and perineal pain: Secondary | ICD-10-CM | POA: Diagnosis present

## 2018-11-11 DIAGNOSIS — Z79899 Other long term (current) drug therapy: Secondary | ICD-10-CM | POA: Insufficient documentation

## 2018-11-11 DIAGNOSIS — N938 Other specified abnormal uterine and vaginal bleeding: Secondary | ICD-10-CM | POA: Insufficient documentation

## 2018-11-11 DIAGNOSIS — F319 Bipolar disorder, unspecified: Secondary | ICD-10-CM | POA: Insufficient documentation

## 2018-11-11 DIAGNOSIS — F419 Anxiety disorder, unspecified: Secondary | ICD-10-CM | POA: Insufficient documentation

## 2018-11-11 DIAGNOSIS — G8929 Other chronic pain: Secondary | ICD-10-CM | POA: Diagnosis present

## 2018-11-11 DIAGNOSIS — N939 Abnormal uterine and vaginal bleeding, unspecified: Secondary | ICD-10-CM

## 2018-11-11 DIAGNOSIS — J45909 Unspecified asthma, uncomplicated: Secondary | ICD-10-CM | POA: Diagnosis not present

## 2018-11-11 DIAGNOSIS — Z98891 History of uterine scar from previous surgery: Secondary | ICD-10-CM

## 2018-11-11 DIAGNOSIS — K219 Gastro-esophageal reflux disease without esophagitis: Secondary | ICD-10-CM | POA: Insufficient documentation

## 2018-11-11 DIAGNOSIS — Z9889 Other specified postprocedural states: Secondary | ICD-10-CM

## 2018-11-11 LAB — CBC
HCT: 40.2 % (ref 36.0–46.0)
Hemoglobin: 13 g/dL (ref 12.0–15.0)
MCH: 31 pg (ref 26.0–34.0)
MCHC: 32.3 g/dL (ref 30.0–36.0)
MCV: 95.7 fL (ref 80.0–100.0)
Platelets: 276 10*3/uL (ref 150–400)
RBC: 4.2 MIL/uL (ref 3.87–5.11)
RDW: 11.9 % (ref 11.5–15.5)
WBC: 12.2 10*3/uL — ABNORMAL HIGH (ref 4.0–10.5)
nRBC: 0 % (ref 0.0–0.2)

## 2018-11-11 LAB — CREATININE, SERUM
Creatinine, Ser: 0.68 mg/dL (ref 0.44–1.00)
GFR calc Af Amer: >60 mL/min (ref >60)
GFR calc non Af Amer: >60 mL/min (ref >60)

## 2018-11-11 LAB — TYPE AND SCREEN
ABO/RH(D): A NEG
Antibody Screen: NEGATIVE

## 2018-11-11 SURGERY — XI ROBOTIC ASSISTED TOTAL HYSTERECTOMY WITH SALPINGECTOMY
Anesthesia: General | Laterality: Bilateral

## 2018-11-11 MED ORDER — DIPHENHYDRAMINE HCL 50 MG/ML IJ SOLN
INTRAMUSCULAR | Status: DC | PRN
  Administered 2018-11-11: 12.5 mg via INTRAVENOUS

## 2018-11-11 MED ORDER — PROMETHAZINE HCL 25 MG/ML IJ SOLN: 6.2500 mg | INTRAMUSCULAR | Status: DC | PRN

## 2018-11-11 MED ORDER — SUGAMMADEX SODIUM 200 MG/2ML IV SOLN
INTRAVENOUS | Status: DC | PRN
  Administered 2018-11-11: 300 mg via INTRAVENOUS

## 2018-11-11 MED ORDER — SUGAMMADEX SODIUM 200 MG/2ML IV SOLN
INTRAVENOUS | Status: AC
  Filled 2018-11-11: qty 2

## 2018-11-11 MED ORDER — SIMETHICONE 80 MG PO CHEW: 80.0000 mg | CHEWABLE_TABLET | Freq: Four times a day (QID) | ORAL | Status: DC | PRN

## 2018-11-11 MED ORDER — LIDOCAINE 2% (20 MG/ML) 5 ML SYRINGE
INTRAMUSCULAR | Status: AC
  Filled 2018-11-11: qty 10

## 2018-11-11 MED ORDER — CEFAZOLIN SODIUM-DEXTROSE 2-4 GM/100ML-% IV SOLN
2.0000 g | INTRAVENOUS | Status: AC
  Administered 2018-11-11: 2 g via INTRAVENOUS
  Filled 2018-11-11: qty 100

## 2018-11-11 MED ORDER — DESVENLAFAXINE SUCCINATE ER 100 MG PO TB24: 100.0000 mg | ORAL_TABLET | Freq: Every day | ORAL | 0 refills | Status: AC

## 2018-11-11 MED ORDER — ENOXAPARIN SODIUM 40 MG/0.4ML ~~LOC~~ SOLN: 40.0000 mg | SUBCUTANEOUS | Status: DC

## 2018-11-11 MED ORDER — OXYCODONE-ACETAMINOPHEN 5-325 MG PO TABS: 1.0000 | ORAL_TABLET | Freq: Four times a day (QID) | ORAL | 0 refills | Status: DC | PRN

## 2018-11-11 MED ORDER — PHENYLEPHRINE 40 MCG/ML (10ML) SYRINGE FOR IV PUSH (FOR BLOOD PRESSURE SUPPORT)
PREFILLED_SYRINGE | INTRAVENOUS | Status: DC | PRN
  Administered 2018-11-11: 80 ug via INTRAVENOUS

## 2018-11-11 MED ORDER — ONDANSETRON HCL 4 MG PO TABS: 4.0000 mg | ORAL_TABLET | Freq: Four times a day (QID) | ORAL | Status: DC | PRN

## 2018-11-11 MED ORDER — SODIUM CHLORIDE 0.9 % IR SOLN
Status: DC | PRN
  Administered 2018-11-11: 1000 mL

## 2018-11-11 MED ORDER — ONDANSETRON HCL 4 MG/2ML IJ SOLN: 4.0000 mg | Freq: Four times a day (QID) | INTRAMUSCULAR | Status: DC | PRN

## 2018-11-11 MED ORDER — DEXAMETHASONE SODIUM PHOSPHATE 10 MG/ML IJ SOLN
INTRAMUSCULAR | Status: DC | PRN
  Administered 2018-11-11: 10 mg via INTRAVENOUS

## 2018-11-11 MED ORDER — SODIUM CHLORIDE (PF) 0.9 % IJ SOLN
INTRAMUSCULAR | Status: AC
  Filled 2018-11-11: qty 10

## 2018-11-11 MED ORDER — ONDANSETRON HCL 4 MG/2ML IJ SOLN
INTRAMUSCULAR | Status: AC
  Filled 2018-11-11: qty 2

## 2018-11-11 MED ORDER — SCOPOLAMINE 1 MG/3DAYS TD PT72
MEDICATED_PATCH | TRANSDERMAL | Status: AC
  Administered 2018-11-11: 1.5 mg
  Filled 2018-11-11: qty 1

## 2018-11-11 MED ORDER — MONTELUKAST SODIUM 10 MG PO TABS: 10.0000 mg | ORAL_TABLET | Freq: Every day | ORAL | Status: DC

## 2018-11-11 MED ORDER — BUPIVACAINE HCL (PF) 0.5 % IJ SOLN
INTRAMUSCULAR | Status: AC
  Filled 2018-11-11: qty 30

## 2018-11-11 MED ORDER — MIDAZOLAM HCL 2 MG/2ML IJ SOLN
INTRAMUSCULAR | Status: AC
  Filled 2018-11-11: qty 2

## 2018-11-11 MED ORDER — PHENYLEPHRINE 40 MCG/ML (10ML) SYRINGE FOR IV PUSH (FOR BLOOD PRESSURE SUPPORT)
PREFILLED_SYRINGE | INTRAVENOUS | Status: AC
  Filled 2018-11-11: qty 10

## 2018-11-11 MED ORDER — IBUPROFEN 800 MG PO TABS: 800.0000 mg | ORAL_TABLET | Freq: Three times a day (TID) | ORAL | 1 refills | Status: DC | PRN

## 2018-11-11 MED ORDER — BUPIVACAINE HCL (PF) 0.5 % IJ SOLN
INTRAMUSCULAR | Status: DC | PRN
  Administered 2018-11-11: 30 mL

## 2018-11-11 MED ORDER — CARIPRAZINE HCL 1.5 MG PO CAPS
1.5000 mg | ORAL_CAPSULE | Freq: Every day | ORAL | Status: DC
  Administered 2018-11-11: 1.5 mg via ORAL
  Filled 2018-11-11: qty 1

## 2018-11-11 MED ORDER — SCOPOLAMINE 1 MG/3DAYS TD PT72
MEDICATED_PATCH | TRANSDERMAL | Status: DC | PRN
  Administered 2018-11-11: 1 via TRANSDERMAL

## 2018-11-11 MED ORDER — ZOLPIDEM TARTRATE 5 MG PO TABS: 5.0000 mg | ORAL_TABLET | Freq: Every evening | ORAL | Status: DC | PRN

## 2018-11-11 MED ORDER — KETOROLAC TROMETHAMINE 30 MG/ML IJ SOLN
30.0000 mg | Freq: Four times a day (QID) | INTRAMUSCULAR | Status: DC
  Administered 2018-11-11: 30 mg via INTRAVENOUS
  Filled 2018-11-11: qty 1

## 2018-11-11 MED ORDER — FENTANYL CITRATE (PF) 250 MCG/5ML IJ SOLN
INTRAMUSCULAR | Status: AC
  Filled 2018-11-11: qty 5

## 2018-11-11 MED ORDER — DIPHENHYDRAMINE HCL 50 MG/ML IJ SOLN
INTRAMUSCULAR | Status: AC
  Filled 2018-11-11: qty 1

## 2018-11-11 MED ORDER — ROCURONIUM BROMIDE 10 MG/ML (PF) SYRINGE
PREFILLED_SYRINGE | INTRAVENOUS | Status: AC
  Filled 2018-11-11: qty 10

## 2018-11-11 MED ORDER — FENTANYL CITRATE (PF) 100 MCG/2ML IJ SOLN: 25.0000 ug | INTRAMUSCULAR | Status: DC | PRN

## 2018-11-11 MED ORDER — ZOLPIDEM TARTRATE 10 MG PO TABS: 10.0000 mg | ORAL_TABLET | Freq: Every day | ORAL | 0 refills | Status: AC

## 2018-11-11 MED ORDER — ARTIFICIAL TEARS OPHTHALMIC OINT
TOPICAL_OINTMENT | OPHTHALMIC | Status: AC
  Filled 2018-11-11: qty 3.5

## 2018-11-11 MED ORDER — BREXPIPRAZOLE 2 MG PO TABS
4.0000 mg | ORAL_TABLET | Freq: Every day | ORAL | Status: DC
  Administered 2018-11-11: 4 mg via ORAL
  Filled 2018-11-11: qty 2

## 2018-11-11 MED ORDER — HYDROMORPHONE HCL 1 MG/ML IJ SOLN: 0.2000 mg | INTRAMUSCULAR | Status: DC | PRN

## 2018-11-11 MED ORDER — ALBUTEROL SULFATE (2.5 MG/3ML) 0.083% IN NEBU: 3.0000 mL | INHALATION_SOLUTION | Freq: Four times a day (QID) | RESPIRATORY_TRACT | Status: DC | PRN

## 2018-11-11 MED ORDER — LIDOCAINE 2% (20 MG/ML) 5 ML SYRINGE
INTRAMUSCULAR | Status: AC
  Filled 2018-11-11: qty 5

## 2018-11-11 MED ORDER — DEXAMETHASONE SODIUM PHOSPHATE 10 MG/ML IJ SOLN
INTRAMUSCULAR | Status: AC
  Filled 2018-11-11: qty 1

## 2018-11-11 MED ORDER — PROPOFOL 10 MG/ML IV BOLUS
INTRAVENOUS | Status: AC
  Filled 2018-11-11: qty 40

## 2018-11-11 MED ORDER — BREXPIPRAZOLE 4 MG PO TABS: 1.0000 | ORAL_TABLET | Freq: Every day | ORAL | 0 refills | Status: AC

## 2018-11-11 MED ORDER — ALPRAZOLAM 2 MG PO TABS: 2.0000 mg | ORAL_TABLET | Freq: Every day | ORAL | 0 refills | Status: AC

## 2018-11-11 MED ORDER — DESVENLAFAXINE SUCCINATE ER 50 MG PO TB24
100.0000 mg | ORAL_TABLET | Freq: Every day | ORAL | Status: DC
  Administered 2018-11-11: 100 mg via ORAL
  Filled 2018-11-11: qty 2

## 2018-11-11 MED ORDER — BREXPIPRAZOLE 4 MG PO TABS: 1.0000 | ORAL_TABLET | Freq: Every day | ORAL | Status: DC

## 2018-11-11 MED ORDER — GABAPENTIN 300 MG PO CAPS
900.0000 mg | ORAL_CAPSULE | ORAL | Status: AC
  Administered 2018-11-11: 900 mg via ORAL
  Filled 2018-11-11: qty 3

## 2018-11-11 MED ORDER — MONTELUKAST SODIUM 10 MG PO TABS: 10.0000 mg | ORAL_TABLET | Freq: Every day | ORAL | 0 refills | Status: DC

## 2018-11-11 MED ORDER — MIDAZOLAM HCL 2 MG/2ML IJ SOLN
INTRAMUSCULAR | Status: DC | PRN
  Administered 2018-11-11: 2 mg via INTRAVENOUS

## 2018-11-11 MED ORDER — PANTOPRAZOLE SODIUM 40 MG PO TBEC
40.0000 mg | DELAYED_RELEASE_TABLET | Freq: Every day | ORAL | Status: DC
  Administered 2018-11-11: 40 mg via ORAL
  Filled 2018-11-11: qty 1

## 2018-11-11 MED ORDER — SOD CITRATE-CITRIC ACID 500-334 MG/5ML PO SOLN: 30.0000 mL | ORAL | Status: DC

## 2018-11-11 MED ORDER — LIDOCAINE 2% (20 MG/ML) 5 ML SYRINGE
INTRAMUSCULAR | Status: DC | PRN
  Administered 2018-11-11: 40 mg via INTRAVENOUS

## 2018-11-11 MED ORDER — IBUPROFEN 400 MG PO TABS: 800.0000 mg | ORAL_TABLET | Freq: Four times a day (QID) | ORAL | Status: DC

## 2018-11-11 MED ORDER — LACTATED RINGERS IV SOLN
INTRAVENOUS | Status: DC
  Administered 2018-11-11 (×2): via INTRAVENOUS

## 2018-11-11 MED ORDER — ONDANSETRON HCL 4 MG/2ML IJ SOLN
INTRAMUSCULAR | Status: DC | PRN
  Administered 2018-11-11 (×2): 4 mg via INTRAVENOUS

## 2018-11-11 MED ORDER — CARIPRAZINE HCL 1.5 MG PO CAPS: 1.5000 mg | ORAL_CAPSULE | Freq: Every day | ORAL | 0 refills | Status: DC

## 2018-11-11 MED ORDER — FENTANYL CITRATE (PF) 100 MCG/2ML IJ SOLN
INTRAMUSCULAR | Status: DC | PRN
  Administered 2018-11-11: 50 ug via INTRAVENOUS
  Administered 2018-11-11: 100 ug via INTRAVENOUS
  Administered 2018-11-11 (×2): 50 ug via INTRAVENOUS

## 2018-11-11 MED ORDER — BISACODYL 10 MG RE SUPP: 10.0000 mg | Freq: Every day | RECTAL | Status: DC | PRN

## 2018-11-11 MED ORDER — LIDOCAINE 2% (20 MG/ML) 5 ML SYRINGE
INTRAMUSCULAR | Status: DC | PRN
  Administered 2018-11-11: 1.5 mg/kg/h via INTRAVENOUS

## 2018-11-11 MED ORDER — POLYETHYLENE GLYCOL 3350 17 G PO PACK: 17.0000 g | PACK | Freq: Every day | ORAL | Status: DC | PRN

## 2018-11-11 MED ORDER — KETOROLAC TROMETHAMINE 30 MG/ML IJ SOLN: 30.0000 mg | Freq: Once | INTRAMUSCULAR | Status: DC

## 2018-11-11 MED ORDER — SODIUM CHLORIDE (PF) 0.9 % IJ SOLN
INTRAMUSCULAR | Status: DC | PRN
  Administered 2018-11-11: 10 mL

## 2018-11-11 MED ORDER — ROCURONIUM BROMIDE 10 MG/ML (PF) SYRINGE
PREFILLED_SYRINGE | INTRAVENOUS | Status: DC | PRN
  Administered 2018-11-11: 20 mg via INTRAVENOUS
  Administered 2018-11-11: 60 mg via INTRAVENOUS

## 2018-11-11 MED ORDER — OXYCODONE-ACETAMINOPHEN 5-325 MG PO TABS
1.0000 | ORAL_TABLET | ORAL | Status: DC | PRN
  Administered 2018-11-11: 1 via ORAL
  Administered 2018-11-11: 2 via ORAL
  Filled 2018-11-11: qty 1
  Filled 2018-11-11: qty 2

## 2018-11-11 MED ORDER — PROPOFOL 10 MG/ML IV BOLUS
INTRAVENOUS | Status: DC | PRN
  Administered 2018-11-11: 200 mg via INTRAVENOUS

## 2018-11-11 MED ORDER — ALPRAZOLAM 1 MG PO TABS: 2.0000 mg | ORAL_TABLET | Freq: Every day | ORAL | Status: DC

## 2018-11-11 MED ORDER — CELECOXIB 200 MG PO CAPS
400.0000 mg | ORAL_CAPSULE | ORAL | Status: AC
  Administered 2018-11-11: 400 mg via ORAL
  Filled 2018-11-11: qty 2

## 2018-11-11 SURGICAL SUPPLY — 68 items
APPLICATOR ARISTA FLEXITIP XL (MISCELLANEOUS) ×1 IMPLANT
APPLIER CLIP 5 13 M/L LIGAMAX5 (MISCELLANEOUS)
BARRIER ADHS 3X4 INTERCEED (GAUZE/BANDAGES/DRESSINGS) IMPLANT
CANISTER SUCT 3000ML PPV (MISCELLANEOUS) ×1 IMPLANT
CATH FOLEY 3WAY  5CC 16FR (CATHETERS) ×1
CATH FOLEY 3WAY 5CC 16FR (CATHETERS) ×1 IMPLANT
CLIP APPLIE 5 13 M/L LIGAMAX5 (MISCELLANEOUS) IMPLANT
COVER BACK TABLE 60X90IN (DRAPES) ×2 IMPLANT
COVER TIP SHEARS 8 DVNC (MISCELLANEOUS) ×1 IMPLANT
COVER TIP SHEARS 8MM DA VINCI (MISCELLANEOUS) ×1
DECANTER SPIKE VIAL GLASS SM (MISCELLANEOUS) ×1 IMPLANT
DEFOGGER SCOPE WARMER CLEARIFY (MISCELLANEOUS) ×2 IMPLANT
DERMABOND ADVANCED (GAUZE/BANDAGES/DRESSINGS) ×1
DERMABOND ADVANCED .7 DNX12 (GAUZE/BANDAGES/DRESSINGS) ×1 IMPLANT
DRAPE ARM DVNC X/XI (DISPOSABLE) ×3 IMPLANT
DRAPE COLUMN DVNC XI (DISPOSABLE) ×1 IMPLANT
DRAPE DA VINCI XI ARM (DISPOSABLE) ×3
DRAPE DA VINCI XI COLUMN (DISPOSABLE) ×1
DURAPREP 26ML APPLICATOR (WOUND CARE) ×2 IMPLANT
ELECT REM PT RETURN 9FT ADLT (ELECTROSURGICAL) ×2
ELECTRODE REM PT RTRN 9FT ADLT (ELECTROSURGICAL) ×1 IMPLANT
GLOVE BIO SURGEON STRL SZ7 (GLOVE) ×4 IMPLANT
GLOVE BIOGEL PI IND STRL 7.0 (GLOVE) ×5 IMPLANT
GLOVE BIOGEL PI INDICATOR 7.0 (GLOVE) ×5
GYRUS RUMI II 2.5CM BLUE (DISPOSABLE)
GYRUS RUMI II 3.5CM BLUE (DISPOSABLE)
GYRUS RUMI II 4.0CM BLUE (DISPOSABLE)
HEMOSTAT ARISTA ABSORB 3G PWDR (MISCELLANEOUS) ×1 IMPLANT
IRRIG SUCT STRYKERFLOW 2 WTIP (MISCELLANEOUS) ×2
IRRIGATION SUCT STRKRFLW 2 WTP (MISCELLANEOUS) ×1 IMPLANT
LEGGING LITHOTOMY PAIR STRL (DRAPES) ×2 IMPLANT
NEEDLE INSUFFLATION 120MM (ENDOMECHANICALS) ×2 IMPLANT
OBTURATOR OPTICAL STANDARD 8MM (TROCAR) ×1
OBTURATOR OPTICAL STND 8 DVNC (TROCAR) ×1
OBTURATOR OPTICALSTD 8 DVNC (TROCAR) ×1 IMPLANT
OCCLUDER COLPOPNEUMO (BALLOONS) ×2 IMPLANT
PACK ROBOT WH (CUSTOM PROCEDURE TRAY) ×2 IMPLANT
PACK ROBOTIC GOWN (GOWN DISPOSABLE) ×2 IMPLANT
PACK TRENDGUARD 450 HYBRID PRO (MISCELLANEOUS) IMPLANT
PAD PREP 24X48 CUFFED NSTRL (MISCELLANEOUS) ×2 IMPLANT
PROTECTOR NERVE ULNAR (MISCELLANEOUS) ×4 IMPLANT
RUMI II 3.0CM BLUE KOH-EFFICIE (DISPOSABLE) IMPLANT
RUMI II GYRUS 2.5CM BLUE (DISPOSABLE) IMPLANT
RUMI II GYRUS 3.5CM BLUE (DISPOSABLE) IMPLANT
RUMI II GYRUS 4.0CM BLUE (DISPOSABLE) IMPLANT
SEAL CANN UNIV 5-8 DVNC XI (MISCELLANEOUS) ×3 IMPLANT
SEAL XI 5MM-8MM UNIVERSAL (MISCELLANEOUS) ×3
SEALER VESSEL DA VINCI XI (MISCELLANEOUS) ×1
SEALER VESSEL EXT DVNC XI (MISCELLANEOUS) ×1 IMPLANT
SET CYSTO W/LG BORE CLAMP LF (SET/KITS/TRAYS/PACK) IMPLANT
SET TRI-LUMEN FLTR TB AIRSEAL (TUBING) ×2 IMPLANT
SUT VIC AB 0 CT1 27 (SUTURE) ×2
SUT VIC AB 0 CT1 27XBRD ANBCTR (SUTURE) ×2 IMPLANT
SUT VICRYL 0 UR6 27IN ABS (SUTURE) IMPLANT
SUT VICRYL 4-0 PS2 18IN ABS (SUTURE) ×4 IMPLANT
SUT VLOC 180 0 9IN  GS21 (SUTURE) ×1
SUT VLOC 180 0 9IN GS21 (SUTURE) ×1 IMPLANT
SYSTEM CARTER THOMASON II (TROCAR) IMPLANT
TIP RUMI ORANGE 6.7MMX12CM (TIP) IMPLANT
TIP UTERINE 5.1X6CM LAV DISP (MISCELLANEOUS) IMPLANT
TIP UTERINE 6.7X10CM GRN DISP (MISCELLANEOUS) ×1 IMPLANT
TIP UTERINE 6.7X6CM WHT DISP (MISCELLANEOUS) IMPLANT
TIP UTERINE 6.7X8CM BLUE DISP (MISCELLANEOUS) IMPLANT
TOWEL OR 17X24 6PK STRL BLUE (TOWEL DISPOSABLE) ×4 IMPLANT
TRENDGUARD 450 HYBRID PRO PACK (MISCELLANEOUS) ×2
TROCAR PORT AIRSEAL 5X120 (TROCAR) ×2 IMPLANT
WATER STERILE IRR 1000ML POUR (IV SOLUTION) ×1 IMPLANT
WATER STERILE IRR 500ML POUR (IV SOLUTION) ×1 IMPLANT

## 2018-11-11 NOTE — Brief Op Note (Signed)
11/11/2018  10:01 AM  PATIENT:  Emogene Morgan  33 y.o. female  PRE-OPERATIVE DIAGNOSIS:  AUB Pelvic Pain  POST-OPERATIVE DIAGNOSIS:  AUB Pelvic Pain  PROCEDURE:  Procedure(s): XI ROBOTIC ASSISTED TOTAL HYSTERECTOMY WITH SALPINGECTOMY (Bilateral)  SURGEON:  Surgeon(s) and Role:    * Lavonia Drafts, MD - Primary    * Constant, Peggy, MD - Assisting  ANESTHESIA:   general  EBL:  50 mL   BLOOD ADMINISTERED:none  DRAINS: none   LOCAL MEDICATIONS USED:  MARCAINE     SPECIMEN:  Source of Specimen:  uterus, cervix, and bilateral fallopian tubes.    DISPOSITION OF SPECIMEN:  PATHOLOGY  COUNTS:  YES  TOURNIQUET:  * No tourniquets in log *  DICTATION: .Note written in [place]: discharge later today after prolonged observation  PATIENT DISPOSITION:  PACU - hemodynamically stable.   Delay start of Pharmacological VTE agent (>24hrs) due to surgical blood loss or risk of bleeding: yes  Complications: none immediate  Yalanda Soderman L. Harraway-Smith, M.D., [facility]

## 2018-11-11 NOTE — Op Note (Signed)
11/11/2018  10:01 AM  PATIENT:  Jade Gates  33 y.o. female  PRE-OPERATIVE DIAGNOSIS:  AUB Pelvic Pain  POST-OPERATIVE DIAGNOSIS:  AUB Pelvic Pain  PROCEDURE:  Procedure(s): XI ROBOTIC ASSISTED TOTAL HYSTERECTOMY WITH SALPINGECTOMY (Bilateral)  SURGEON:  Surgeon(s) and Role:    * Lavonia Drafts, MD - Primary    * Constant, Peggy, MD - Assisting  ANESTHESIA:   general  EBL:  50 mL   BLOOD ADMINISTERED:none  DRAINS: none   LOCAL MEDICATIONS USED:  MARCAINE     SPECIMEN:  Source of Specimen:  uterus, cervix, and bilateral fallopian tubes.    DISPOSITION OF SPECIMEN:  PATHOLOGY  COUNTS:  YES  TOURNIQUET:  * No tourniquets in log *  DICTATION: .Note written in [place]: discharge later today after prolonged observation  PATIENT DISPOSITION:  PACU - hemodynamically stable.   Delay start of Pharmacological VTE agent (>24hrs) due to surgical blood loss or risk of bleeding: yes  Complications: none immediate  The risks, benefits, and alternatives of surgery were explained, understood, and accepted. Consents were signed. All questions were answered. She was taken to the operating room and general anesthesia was applied without complication. She was placed in the dorsal lithotomy position and her abdomen and vagina were prepped and draped after she had been carefully positioned on the table. A bimanual exam revealed a 10 week size uterus that was mobile. Her adnexa were not enlarged. The cervix was measured and the uterus was sounded to 11 cm. A Rumi uterine manipulator was placed without difficulty. A Foley catheter was placed and it drained clear throughout the case. Gloves were changed and attention was turned to the abdomen. A 45mm incision was made in the umbilicus and a Veress needle was placed intraperitoneally. CO2 was used to insufflate the abdomen to approximately 4 L. After good pneumoperitoneum was established, a 8 mm trocar was placed in the  umbilicus.  Laparoscopy confirmed correct placement. She was placed in Trendelenburg position and ports were placed in appropriate positions on her abdomen to allow maximum exposure during the robotic case. Specifically there was an 6mm assistant port placed in the left lower quadrant under direct laproscopic visualization. Two 8 mm ports were placed 8cm lateral to the midline port.  These were all placed under direct laparoscopic visualization. The robot was docked and I proceeded with a robotic portion of the case.  The pelvis was inspected and the uterus was found to have fibroids and be enlarged.  The fallopian tubes and ovaries were found to be normal. The remainder of her pelvis appeared normal with the exception of adhesions of bowel to the adnexa and sidewall on the left side.  These were released sharply. The ureters and the infundibulopelvic ligaments were identified. I excised the fallopian tubes bilaterally. The round ligament on each side was cauterized and cut. The Vessel Sealer instrument was used for this portion. The round ligaments were identified, cauterized and ligated, a bladder flap was created anteriorly. The uterine vessels were identified and cauterized and then cut.The bladder was pushed out of the operative site and an anterior colpotomy was made. The colpotomy incision was extended circumferentially, following the blue outline of the Rumi manipulator. All pedicles were hemostatic.  The uterus was removed from the vagina with the fallopian tube segments. The vaginal cuff was closed with v-lock suture.  Excellent hemostasis was noted throughout. The pelvis was irrigated. The intraabdominal pressure was lowered assess hemostasis. After determining excellent hemostasis, the robot  was undocked. The port skin incisions were closed with 3-0 vicryl.  30cc total of 0.5% Marcaine was injected into the port sites.  The patient was then extubated and taken to recovery in stable condition.    Sponge, lap and needle counts were correct x 2.  Jade Gates, M.D., Jade Gates

## 2018-11-11 NOTE — Progress Notes (Signed)
Pt asked to have a word with nurse privately, after husband out of room pt states her husband is trying to rush her out of the hospital and she is uncomfortable with that.  States he is eager to get back to work and doesn't feel this surgery is a big deal because she is doing so well.  Asked pt if she felt she would be responsible for the running of the house and childcare when she returns home and she nodded her head yes.

## 2018-11-11 NOTE — Discharge Instructions (Signed)
Total Laparoscopic Hysterectomy, Care After °This sheet gives you information about how to care for yourself after your procedure. Your health care provider may also give you more specific instructions. If you have problems or questions, contact your health care provider. °What can I expect after the procedure? °After the procedure, it is common to have: °· Pain and bruising around your incisions. °· A sore throat, if a breathing tube was used during surgery. °· Fatigue. °· Poor appetite. °· Less interest in sex. °If your ovaries were also removed, it is also common to have symptoms of menopause such as hot flashes, night sweats, and lack of sleep (insomnia). °Follow these instructions at home: °Bathing °· Do not take baths, swim, or use a hot tub until your health care provider approves. You may need to only take showers for 2-3 weeks. °· Keep your bandage (dressing) dry until your health care provider says it can be removed. °Incision care ° °· Follow instructions from your health care provider about how to take care of your incisions. Make sure you: °? Wash your hands with soap and water before you change your dressing. If soap and water are not available, use hand sanitizer. °? Change your dressing as told by your health care provider. °? Leave stitches (sutures), skin glue, or adhesive strips in place. These skin closures may need to stay in place for 2 weeks or longer. If adhesive strip edges start to loosen and curl up, you may trim the loose edges. Do not remove adhesive strips completely unless your health care provider tells you to do that. °· Check your incision area every day for signs of infection. Check for: °? Redness, swelling, or pain. °? Fluid or blood. °? Warmth. °? Pus or a bad smell. °Activity °· Get plenty of rest and sleep. °· Do not lift anything that is heavier than 10 lbs (4.5 kg) for one month after surgery, or as long as told by your health care provider. °· Do not drive or use heavy  machinery while taking prescription pain medicine. °· Do not drive for 24 hours if you were given a medicine to help you relax (sedative). °· Return to your normal activities as told by your health care provider. Ask your health care provider what activities are safe for you. °Lifestyle ° °· Do not use any products that contain nicotine or tobacco, such as cigarettes and e-cigarettes. These can delay healing. If you need help quitting, ask your health care provider. °· Do not drink alcohol until your health care provider approves. °General instructions °· Do not douche, use tampons, or have sex for at least 6 weeks, or as told by your health care provider. °· Take over-the-counter and prescription medicines only as told by your health care provider. °· To monitor yourself for a fever, take your temperature at least once a day during recovery. °· If you struggle with physical or emotional changes after your procedure, speak with your health care provider or a therapist. °· To prevent or treat constipation while you are taking prescription pain medicine, your health care provider may recommend that you: °? Drink enough fluid to keep your urine clear or pale yellow. °? Take over-the-counter or prescription medicines. °? Eat foods that are high in fiber, such as fresh fruits and vegetables, whole grains, and beans. °? Limit foods that are high in fat and processed sugars, such as fried and sweet foods. °· Keep all follow-up visits as told by your health care provider.   This is important. °Contact a health care provider if: °· You have chills or a fever. °· You have redness, swelling, or pain around an incision. °· You have fluid or blood coming from an incision. °· Your incision feels warm to the touch. °· You have pus or a bad smell coming from an incision. °· An incision breaks open. °· You feel dizzy or light-headed. °· You have pain or bleeding when you urinate. °· You have diarrhea, nausea, or vomiting that does not  go away. °· You have abnormal vaginal discharge. °· You have a rash. °· You have pain that does not get better with medicine. °Get help right away if: °· You have a fever and your symptoms suddenly get worse. °· You have severe abdominal pain. °· You have chest pain. °· You have shortness of breath. °· You faint. °· You have pain, swelling, or redness on your leg. °· You have heavy vaginal bleeding with blood clots. °Summary °· After the procedure it is common to have abdominal pain. Your provider will give you medication for this. °· Do not take baths, swim, or use a hot tub until your health care provider approves. °· Do not lift anything that is heavier than 10 lbs (4.5 kg) for one month after surgery, or as long as told by your health care provider. °· Notify your provider if you have any signs or symptoms of infection after the procedure. °This information is not intended to replace advice given to you by your health care provider. Make sure you discuss any questions you have with your health care provider. °Document Released: 08/12/2013 Document Revised: 01/02/2017 Document Reviewed: 01/02/2017 °Elsevier Interactive Patient Education © 2019 [employer] ° °

## 2018-11-11 NOTE — Anesthesia Procedure Notes (Signed)
Procedure Name: Intubation Date/Time: 11/11/2018 7:38 AM Performed by: Duane Boston, MD Pre-anesthesia Checklist: Patient identified, Emergency Drugs available, Suction available and Patient being monitored Patient Re-evaluated:Patient Re-evaluated prior to induction Oxygen Delivery Method: Circle system utilized Preoxygenation: Pre-oxygenation with 100% oxygen Induction Type: IV induction Ventilation: Mask ventilation without difficulty Laryngoscope Size: Mac and 3 Tube type: Oral Tube size: 7.0 mm Number of attempts: 1 Airway Equipment and Method: Stylet Placement Confirmation: ETT inserted through vocal cords under direct vision,  positive ETCO2 and breath sounds checked- equal and bilateral Secured at: 21 cm Tube secured with: Tape Dental Injury: Teeth and Oropharynx as per pre-operative assessment

## 2018-11-11 NOTE — Anesthesia Postprocedure Evaluation (Signed)
Anesthesia Post Note  Patient: Arts development officer  Procedure(s) Performed: XI ROBOTIC ASSISTED TOTAL HYSTERECTOMY WITH SALPINGECTOMY (Bilateral )     Patient location during evaluation: PACU Anesthesia Type: General Level of consciousness: sedated Pain management: pain level controlled Vital Signs Assessment: post-procedure vital signs reviewed and stable Respiratory status: spontaneous breathing and respiratory function stable Cardiovascular status: stable Postop Assessment: no apparent nausea or vomiting Anesthetic complications: no    Last Vitals:  Vitals:   11/11/18 1015 11/11/18 1026  BP: 115/79 120/77  Pulse: 90 88  Resp: 15 12  Temp:  37.2 C  SpO2: 100% 100%    Last Pain:  Vitals:   11/11/18 1015  PainSc: 4                  Gaelyn Tukes DANIEL

## 2018-11-11 NOTE — Transfer of Care (Signed)
Last Vitals:  Vitals Value Taken Time  BP    Temp    Pulse    Resp    SpO2      Last Pain: There were no vitals filed for this visit.    Immediate Anesthesia Transfer of Care Note  Patient: Jade Gates  Procedure(s) Performed: Procedure(s) (LRB): XI ROBOTIC ASSISTED TOTAL HYSTERECTOMY WITH SALPINGECTOMY (Bilateral)  Patient Location: PACU  Anesthesia Type: General  Level of Consciousness: awake, alert  and oriented  Airway & Oxygen Therapy: Patient Spontanous Breathing and Patient connected to face mask oxygen  Post-op Assessment: Report given to PACU RN and Post -op Vital signs reviewed and stable  Post vital signs: Reviewed and stable  Complications: No apparent anesthesia complications

## 2018-11-11 NOTE — Progress Notes (Signed)
Spoke with Dr. Kennon Rounds regarding pts concerns of being discharged.  Dr. Kennon Rounds spoke with Dr. Maylene RoesTamala Julian via text who instructed to discharge patient. Will stress importance of pt not being left alone for a full 24 hours post surgery to husband with discharge instructions.

## 2018-11-12 ENCOUNTER — Telehealth: Payer: Self-pay | Admitting: Obstetrics & Gynecology

## 2018-11-12 NOTE — Telephone Encounter (Signed)
TC to pt. She is s/p RATH with bilateral salpingectomy on yesterday. She reports some burning with urination but no other issues. She has not required any narcotics and her pain is well controlled with NSIADS. She denies N/V.  She is resting well.   All questions anssered.   Pt to f/u with me at the [facility] for her 2 and 6 weeks PP visit. She will go back to [facility] for care after her post op state.   Thaddus Mcdowell L. Harraway-Smith, M.D., Cherlynn June

## 2018-11-14 ENCOUNTER — Telehealth: Payer: Self-pay

## 2018-11-14 NOTE — Telephone Encounter (Signed)
Patient states that she had surgery 11/11/18 and in the hospital they used a patch behind the ear for her nausea. Patient could not remember the name of the patch.  Patient was wondering if Dr. Ihor Dow could prescribe this form of nausea medication for her since it worked well in the hospital. Kathrene Alu RN

## 2018-11-14 NOTE — Telephone Encounter (Signed)
Returned call to patient at Dr. Maylene Roes Smith's request. Patient states that nausea and vomiting are a daily occurrence for her. She has been evaluated by GI and "they don't know what's wrong. " Patient states that she was just stating that the patch was the first time in long time she hasn't gotten sick in the morning.  Dr. Ihor Dow states it the patch used is not something she can be on long term.   Patient states understanding. Kathrene Alu RN

## 2018-11-19 ENCOUNTER — Ambulatory Visit: Payer: BLUE CROSS/BLUE SHIELD | Admitting: Obstetrics & Gynecology

## 2018-11-24 ENCOUNTER — Encounter: Payer: Self-pay | Admitting: Obstetrics & Gynecology

## 2018-11-24 ENCOUNTER — Ambulatory Visit (INDEPENDENT_AMBULATORY_CARE_PROVIDER_SITE_OTHER): Payer: BLUE CROSS/BLUE SHIELD | Admitting: Obstetrics & Gynecology

## 2018-11-24 VITALS — BP 113/74 | HR 87 | Ht 61.0 in | Wt 189.1 lb

## 2018-11-24 DIAGNOSIS — Z9889 Other specified postprocedural states: Secondary | ICD-10-CM

## 2018-11-24 NOTE — Progress Notes (Signed)
History:  33 y.o. [id number] here today for 2 week post op check. Pt is s/p RATH with bilateral salpingectomy on  1/72020. Pt reports no problems. She reports that she is completely surprised at how well she has done. She denies mod or severe pain. She reports no bleeding.  She does report that she has chronic constipation and this is worse since the surgery. She is on narc for her food that make it worse.    The following portions of the patient's history were reviewed and updated as appropriate: allergies, current medications, past family history, past medical history, past social history, past surgical history and problem list.  Review of Systems:  Pertinent items are noted in HPI.    Objective:  Physical Exam Blood pressure 113/74, pulse 87, height 5\' 1"  (1.549 m), weight 189 lb 1.9 oz (85.8 kg), last menstrual period 08/21/2018.  CONSTITUTIONAL: Well-developed, well-nourished female in no acute distress.  HENT:  Normocephalic, atraumatic EYES: Conjunctivae and EOM are normal. No scleral icterus.  NECK: Normal range of motion SKIN: Skin is warm and dry. No rash noted. Not diaphoretic.No pallor. S[place]ent: Alert and oriented to person, place, and time. Normal coordination.  Abd: Soft, nontender and nondistended. Prot sites healing well. The left lower port has some erythema. Pt reports that it was itching as it healed and she was prev scratching it but, she stopped No warmth or fluctuance.   Pelvic:deferred  Labs and Imaging 11/11/2018 Diagnosis Uterus, cervix and bilateral fallopian tubes - UTERUS: -ENDOMETRIUM: WEAKLY SECRETORY TO INACTIVE ENDOMETRIUM. NO HYPERPLASIA OR MALIGNANCY. -MYOMETRIUM: UNREMARKABLE. NO MALIGNANCY. -SEROSA: FIBROUS ADHESIONS. NO MALIGNANCY. - CERVIX: BENIGN SQUAMOUS AND ENDOCERVICAL MUCOSA. NO DYSPLASIA OR MALIGNANCY. - BILATERAL FALLOPIAN TUBES: UNREMARKABLE. NO MALIGNANCY.  Assessment & Plan:  2 week post op check. She is doing very well  reviewed surg  path  qradual increase in activities  No intercourse for 8 full weeks post op  Constipation  Decrease the meds for foot pain and decrease Zofran use  Prunes at night rpn  F/u in 4 weeks or sooner prn  Zyheir Daft L. Harraway-Smith, M.D., Cherlynn June

## 2018-11-24 NOTE — Patient Instructions (Signed)
Total Laparoscopic Hysterectomy, Care After °This sheet gives you information about how to care for yourself after your procedure. Your health care provider may also give you more specific instructions. If you have problems or questions, contact your health care provider. °What can I expect after the procedure? °After the procedure, it is common to have: °· Pain and bruising around your incisions. °· A sore throat, if a breathing tube was used during surgery. °· Fatigue. °· Poor appetite. °· Less interest in sex. °If your ovaries were also removed, it is also common to have symptoms of menopause such as hot flashes, night sweats, and lack of sleep (insomnia). °Follow these instructions at home: °Bathing °· Do not take baths, swim, or use a hot tub until your health care provider approves. You may need to only take showers for 2-3 weeks. °· Keep your bandage (dressing) dry until your health care provider says it can be removed. °Incision care ° °· Follow instructions from your health care provider about how to take care of your incisions. Make sure you: °? Wash your hands with soap and water before you change your dressing. If soap and water are not available, use hand sanitizer. °? Change your dressing as told by your health care provider. °? Leave stitches (sutures), skin glue, or adhesive strips in place. These skin closures may need to stay in place for 2 weeks or longer. If adhesive strip edges start to loosen and curl up, you may trim the loose edges. Do not remove adhesive strips completely unless your health care provider tells you to do that. °· Check your incision area every day for signs of infection. Check for: °? Redness, swelling, or pain. °? Fluid or blood. °? Warmth. °? Pus or a bad smell. °Activity °· Get plenty of rest and sleep. °· Do not lift anything that is heavier than 10 lbs (4.5 kg) for one month after surgery, or as long as told by your health care provider. °· Do not drive or use heavy  machinery while taking prescription pain medicine. °· Do not drive for 24 hours if you were given a medicine to help you relax (sedative). °· Return to your normal activities as told by your health care provider. Ask your health care provider what activities are safe for you. °Lifestyle ° °· Do not use any products that contain nicotine or tobacco, such as cigarettes and e-cigarettes. These can delay healing. If you need help quitting, ask your health care provider. °· Do not drink alcohol until your health care provider approves. °General instructions °· Do not douche, use tampons, or have sex for at least 6 weeks, or as told by your health care provider. °· Take over-the-counter and prescription medicines only as told by your health care provider. °· To monitor yourself for a fever, take your temperature at least once a day during recovery. °· If you struggle with physical or emotional changes after your procedure, speak with your health care provider or a therapist. °· To prevent or treat constipation while you are taking prescription pain medicine, your health care provider may recommend that you: °? Drink enough fluid to keep your urine clear or pale yellow. °? Take over-the-counter or prescription medicines. °? Eat foods that are high in fiber, such as fresh fruits and vegetables, whole grains, and beans. °? Limit foods that are high in fat and processed sugars, such as fried and sweet foods. °· Keep all follow-up visits as told by your health care provider.   This is important. °Contact a health care provider if: °· You have chills or a fever. °· You have redness, swelling, or pain around an incision. °· You have fluid or blood coming from an incision. °· Your incision feels warm to the touch. °· You have pus or a bad smell coming from an incision. °· An incision breaks open. °· You feel dizzy or light-headed. °· You have pain or bleeding when you urinate. °· You have diarrhea, nausea, or vomiting that does not  go away. °· You have abnormal vaginal discharge. °· You have a rash. °· You have pain that does not get better with medicine. °Get help right away if: °· You have a fever and your symptoms suddenly get worse. °· You have severe abdominal pain. °· You have chest pain. °· You have shortness of breath. °· You faint. °· You have pain, swelling, or redness on your leg. °· You have heavy vaginal bleeding with blood clots. °Summary °· After the procedure it is common to have abdominal pain. Your provider will give you medication for this. °· Do not take baths, swim, or use a hot tub until your health care provider approves. °· Do not lift anything that is heavier than 10 lbs (4.5 kg) for one month after surgery, or as long as told by your health care provider. °· Notify your provider if you have any signs or symptoms of infection after the procedure. °This information is not intended to replace advice given to you by your health care provider. Make sure you discuss any questions you have with your health care provider. °Document Released: 08/12/2013 Document Revised: 01/02/2017 Document Reviewed: 01/02/2017 °Elsevier Interactive Patient Education © 2019 [employer] ° °

## 2018-12-01 ENCOUNTER — Telehealth: Payer: Self-pay

## 2018-12-01 NOTE — Telephone Encounter (Signed)
Pt called the office stating that she had robotic assisted hysterectomy on 11/11/18 and is concerned because her incision site is oozing whitish-yellow drainage. Pt denies fever and odor. Explained to pt that is part of the healing process and to call the office if she develops a fever. Understanding was voiced.

## 2018-12-02 ENCOUNTER — Telehealth: Payer: Self-pay

## 2018-12-02 NOTE — Telephone Encounter (Signed)
Pt called the office stating that her incision from her hysterectomy was ripped open by her cat. Pt states that the incision started bleeding and wants to know if she should be seen. Advised pt to go to [facility] after hours clinic to be seen today. Understanding was voiced.  chiquita l wilson, CMA

## 2018-12-08 ENCOUNTER — Telehealth: Payer: Self-pay

## 2018-12-08 NOTE — Telephone Encounter (Signed)
Patient states she is having a lot of abdominal pain around her belly button. Patient states that she feels like she is sitting on a knife. Patient states she has had constipation a lot after her surgery. Patient  states she has been able to go to  have bowel movement today.Patient states she is eating prunes to help with constipation.  Advised patient to try Miralax twice a day for the next week to see if she can help relieve some of the constipation. Patient to call back if symptoms worsen. Kathrene Alu RN

## 2018-12-09 ENCOUNTER — Emergency Department (INDEPENDENT_AMBULATORY_CARE_PROVIDER_SITE_OTHER)
Admission: EM | Admit: 2018-12-09 | Discharge: 2018-12-09 | Disposition: A | Discharge status: Home / Self Care | Payer: BLUE CROSS/BLUE SHIELD | Source: Home / Self Care

## 2018-12-09 ENCOUNTER — Other Ambulatory Visit: Payer: Self-pay

## 2018-12-09 ENCOUNTER — Telehealth: Payer: Self-pay

## 2018-12-09 ENCOUNTER — Emergency Department (INDEPENDENT_AMBULATORY_CARE_PROVIDER_SITE_OTHER): Payer: BLUE CROSS/BLUE SHIELD

## 2018-12-09 DIAGNOSIS — N83202 Unspecified ovarian cyst, left side: Secondary | ICD-10-CM

## 2018-12-09 DIAGNOSIS — K449 Diaphragmatic hernia without obstruction or gangrene: Secondary | ICD-10-CM

## 2018-12-09 DIAGNOSIS — R1031 Right lower quadrant pain: Secondary | ICD-10-CM | POA: Diagnosis not present

## 2018-12-09 LAB — POCT CBC W AUTO DIFF (K'VILLE URGENT CARE)

## 2018-12-09 LAB — POCT URINALYSIS DIP (MANUAL ENTRY)
Bilirubin, UA: NEGATIVE
Blood, UA: NEGATIVE
Glucose, UA: NEGATIVE mg/dL
Ketones, POC UA: NEGATIVE mg/dL
Leukocytes, UA: NEGATIVE
Nitrite, UA: NEGATIVE
Protein Ur, POC: NEGATIVE mg/dL
Spec Grav, UA: <=1.005 — AB
Urobilinogen, UA: 0.2 U/dL
pH, UA: 6

## 2018-12-09 MED ORDER — IOPAMIDOL (ISOVUE-300) INJECTION 61%
100.0000 mL | Freq: Once | INTRAVENOUS | Status: AC | PRN
  Administered 2018-12-09: 100 mL via INTRAVENOUS

## 2018-12-09 NOTE — Telephone Encounter (Signed)
Patient called requesting to be seen since she is still having ongoing abdominal pain. Patient was seen with urgent care and told she has an ovarian cyst.   Patient given appointment tomorrow with Dr. Ihor Dow. Kathrene Alu RN

## 2018-12-09 NOTE — Discharge Instructions (Signed)
°  Please follow up with your family doctor as well as gynecologist for further evaluation and treatment of abdominal pain if it's not improving in a few days.   Call 911 or go to the hospital if symptoms significantly worsening.

## 2018-12-09 NOTE — ED Provider Notes (Signed)
[name]    CSN: [id number] Arrival date & time: 12/09/18  1158     History   Chief Complaint Chief Complaint  Patient presents with  . Abdominal Pain    RT flank    HPI Jade Gates is a 33 y.o. female.   HPI Jade Gates is a 33 y.o. female presenting to UC with c/o Right side abdominal pain that started 2-3 days ago, aching and sharp at times. Starts at her umbilicus and radiates into RLQ. Pt had hysterectomy on 11/11/2018.  She notes pain had fully resolved until just a few days ago.  She was constipated a few days ago but has since had diarrhea since taking miralax and being placed on amoxicillin for an infected surgical incision that her cat accidentally scratched open. The abdominal pain is aching and sharp at times. Denies fever, chills, n/v/d.    Past Medical History:  Diagnosis Date  . Anxiety   . Asthma   . Autism   . Complication of anesthesia   . Depression   . Depression   . Ovarian cyst   . PONV (postoperative nausea and vomiting)   . Reflux   . Vaginal Pap smear, abnormal     Patient Active Problem List   Diagnosis Date Noted  . Post-operative state 11/11/2018  . Chronic female pelvic pain 09/18/2018  . Abnormal uterine bleeding (AUB) 09/18/2018  . Previous cesarean section 09/18/2018  . Bipolar disease, chronic ([place]) 09/18/2018  . Autism disorder 09/18/2018    Past Surgical History:  Procedure Laterality Date  . ANKLE RECONSTRUCTION Left 09/19/2018  . COLPOSCOPY    . IMPLANTATION VAGAL NERVE STIMULATOR    . TONSILLECTOMY      OB History    Gravida  3   Para  3   Term  0   Preterm  3   AB  0   Living  3     SAB  0   TAB  0   Ectopic  0   Multiple  0   Live Births               Home Medications    Prior to Admission medications   Medication Sig Start Date End Date Taking? Authorizing Provider  alprazolam Duanne Moron) 2 MG tablet Take 2 mg by mouth at bedtime.  04/17/17   [provider]    ALPRAZolam Duanne Moron) 2 MG tablet Take 1 tablet (2 mg total) by mouth at bedtime. Patient not taking: Reported on 11/24/2018 11/11/18   Lavonia Drafts, MD  Brexpiprazole 4 MG TABS Take 1 tablet by mouth daily. 11/11/18   Lavonia Drafts, MD  cariprazine (VRAYLAR) capsule Take 1.5 mg by mouth daily.     [provider]  desvenlafaxine (PRISTIQ) 100 MG 24 hr tablet Take 100 mg by mouth daily.    [provider]  desvenlafaxine (PRISTIQ) 100 MG 24 hr tablet Take 1 tablet (100 mg total) by mouth daily. Patient not taking: Reported on 11/24/2018 11/11/18   Lavonia Drafts, MD  Fluticasone-Salmeterol (ADVAIR) 250-50 MCG/DOSE AEPB Inhale 1 puff into the lungs 2 (two) times daily.    [provider]  ibuprofen (ADVIL,MOTRIN) 800 MG tablet Take 1 tablet (800 mg total) by mouth every 8 (eight) hours as needed for moderate pain. 11/11/18   Lavonia Drafts, MD  IRON PO Take 1 tablet by mouth 3 (three) times a week.    [provider]  lubiprostone (AMITIZA) 24 MCG capsule Take 24 mcg  by mouth daily as needed for constipation.     [provider]  meloxicam (MOBIC) 15 MG tablet Take 15 mg by mouth daily.  03/28/18   [provider]  montelukast (SINGULAIR) 10 MG tablet Take 10 mg by mouth at bedtime.     [provider]  omeprazole (PRILOSEC) 40 MG capsule Take 40 mg by mouth 2 (two) times daily.  08/30/16   [provider]  ondansetron (ZOFRAN-ODT) 4 MG disintegrating tablet Take 4 mg by mouth every 8 (eight) hours as needed for nausea or vomiting.    [provider]  oxyCODONE-acetaminophen (PERCOCET/ROXICET) 5-325 MG tablet Take 1 tablet by mouth every 6 (six) hours as needed for moderate pain. Patient taking differently: Take 1 tablet by mouth every 6 (six) hours as needed for moderate pain (1/2 tablet).  11/11/18   Lavonia Drafts, MD  VENTOLIN HFA 108 (90 Base) MCG/ACT inhaler Inhale 2 puffs into the  lungs every 6 (six) hours as needed for wheezing or shortness of breath.  07/14/18   [provider]  Vitamin D, Ergocalciferol, (DRISDOL) 1.25 MG (50000 UT) CAPS capsule TAKE 1 CAPSULE (50,000 UNITS TOTAL) BY MOUTH EVERY 7 (SEVEN) DAYS. 10/13/18   Emily Filbert, MD  zolpidem (AMBIEN) 10 MG tablet Take 1 tablet (10 mg total) by mouth at bedtime. 11/11/18   Lavonia Drafts, MD    Family History Family History  Adopted: Yes    Social History Social History   Tobacco Use  . Smoking status: Never Smoker  . Smokeless tobacco: Never Used  Substance Use Topics  . Alcohol use: No  . Drug use: No     Allergies   Ciprofloxacin; Vortioxetine; Atorvastatin; and Buspar [buspirone]   Review of Systems Review of Systems  Constitutional: Negative for appetite change, chills and fever.  Gastrointestinal: Positive for abdominal pain and diarrhea. Negative for nausea and vomiting.  Genitourinary: Negative for dysuria, flank pain and frequency.  Musculoskeletal: Negative for back pain.     Physical Exam Triage Vital Signs ED Triage Vitals  Enc Vitals Group     BP 12/09/18 1217 115/79     Pulse Rate 12/09/18 1217 (!) 101     Resp --      Temp 12/09/18 1217 97.7 F (36.5 C)     Temp Source 12/09/18 1217 Oral     SpO2 12/09/18 1217 98 %     Weight 12/09/18 1218 192 lb (87.1 kg)     Height 12/09/18 1218 5\' 1"  (1.549 m)     Head Circumference --      Peak Flow --      Pain Score 12/09/18 1218 5     Pain Loc --      Pain Edu? --      Excl. in [place]? --    No data found.  Updated Vital Signs BP 115/79 (BP Location: Right Arm)   Pulse (!) 101   Temp 97.7 F (36.5 C) (Oral)   Ht 5\' 1"  (1.549 m)   Wt 192 lb (87.1 kg)   LMP 08/21/2018   SpO2 98%   BMI 36.28 kg/m   Visual Acuity Right Eye Distance:   Left Eye Distance:   Bilateral Distance:    Right Eye Near:   Left Eye Near:    Bilateral Near:     Physical Exam Vitals signs and nursing note reviewed.    Constitutional:      Appearance: She is well-developed.  HENT:  Head: Normocephalic and atraumatic.  Neck:     Musculoskeletal: Normal range of motion.  Cardiovascular:     Rate and Rhythm: Normal rate and regular rhythm.  Pulmonary:     Effort: Pulmonary effort is normal.     Breath sounds: Normal breath sounds.  Abdominal:     General: There is no distension.     Palpations: Abdomen is soft.     Tenderness: There is abdominal tenderness in the periumbilical area. There is no right CVA tenderness or left CVA tenderness. Negative signs include Murphy's sign and McBurney's sign.       Comments: Well healing surgical incisions with faint erythema of Left lower abdominal incision.   Musculoskeletal: Normal range of motion.  Skin:    General: Skin is warm and dry.  Neurological:     Mental Status: She is alert and oriented to person, place, and time.  Psychiatric:        Behavior: Behavior normal.      UC Treatments / Results  Labs (all labs ordered are listed, but only abnormal results are displayed) Labs Reviewed  POCT URINALYSIS DIP (MANUAL ENTRY) - Abnormal; Notable for the following components:      Result Value   Spec Grav, UA <=1.005 (*)    All other components within normal limits  COMPLETE METABOLIC PANEL WITH GFR  POCT CBC W AUTO DIFF ([facility])    EKG None  Radiology Ct Abdomen Pelvis W Contrast  Result Date: 12/09/2018 CLINICAL DATA:  Lower abdominal pain, primarily right-sided EXAM: CT ABDOMEN AND PELVIS WITH CONTRAST TECHNIQUE: Multidetector CT imaging of the abdomen and pelvis was performed using the standard protocol following bolus administration of intravenous contrast. Oral contrast was also administered. CONTRAST:  155mL ISOVUE-300 IOPAMIDOL (ISOVUE-300) INJECTION 61% COMPARISON:  None. FINDINGS: Lower chest: Lung bases are clear.  There is a small hiatal hernia. Hepatobiliary: There is a degree of hepatic steatosis, most notably at the  level of the fissure for the ligamentum teres. No focal liver lesions are evident. Gallbladder wall is not appreciably thickened. There is no biliary duct dilatation. Pancreas: There is no evident pancreatic mass or inflammatory focus. Spleen: No splenic lesions are evident. Adrenals/Urinary Tract: Adrenals bilaterally appear normal. Kidneys bilaterally show no evident mass or hydronephrosis on either side. There is no appreciable renal or ureteral calculus on either side. Urinary bladder is midline with wall thickness within normal limits. Stomach/Bowel: There is no appreciable bowel wall or mesenteric thickening. There is no evident bowel obstruction. There is no free air or portal venous air. Vascular/Lymphatic: There is no abdominal aortic aneurysm. No vascular lesions are appreciable. There is no evident adenopathy in the abdomen or pelvis. Reproductive: Uterus is absent. There is a cyst arising in the left adnexal region containing an apparent focus of debris measuring 3.3 x 3.5 x 2.8 cm. No other pelvic masses are evident. Other: The appendix appears normal. There is no abscess or ascites in the abdomen or pelvis. There is a small ventral hernia containing only fat. Musculoskeletal: There are no blastic or lytic bone lesions. No intramuscular lesions are evident. IMPRESSION: 1. Suspect hemorrhagic left ovarian cyst measuring 3.3 x 3.5 x 2.8 cm. Pelvic ultrasound could be helpful to further assess this area. Uterus absent. 2. No evident bowel obstruction. No abscess evident in the abdomen or pelvis. Appendix appears normal. 3.  No evident renal or ureteral calculus.  No hydronephrosis. 4.  Hepatic steatosis. 5.  Small hiatal hernia.  Small ventral  hernia containing only fat. Electronically Signed   By: Lowella Grip III M.D.   On: 12/09/2018 14:10    Procedures Procedures (including critical care time)  Medications Ordered in UC Medications - No data to display  Initial Impression / Assessment and  Plan / UC Course  I have reviewed the triage vital signs and the nursing notes.  Pertinent labs & imaging results that were available during my care of the patient were reviewed by me and considered in my medical decision making (see chart for details).     Reassured pt of normal appendix. No findings on imaging to explain pt's RLQ abdominal pain Advised pt she does have an ovarian cyst on the Left side, hx of ovarian cysts in the past.  Encouraged f/u with GYN for monitoring of the cyst.   Final Clinical Impressions(s) / UC Diagnoses   Final diagnoses:  Right lower quadrant abdominal pain  Left ovarian cyst     Discharge Instructions      Please follow up with your family doctor as well as gynecologist for further evaluation and treatment of abdominal pain if it's not improving in a few days.   Call 911 or go to the hospital if symptoms significantly worsening.    ED Prescriptions    None     Controlled Substance Prescriptions [place] Controlled Substance Registry consulted? Not Applicable   Tyrell Antonio 12/09/18 1539

## 2018-12-09 NOTE — ED Triage Notes (Signed)
Pt c/o RT flank abd pain x 2 days. Has seen a GI and OBGYN for her pain. They have both told her she is constipated due to hysterectomy surgery. Told to take miralax which has caused her diarrhea for 2 days. Also currently on amoxicillin.  Says pain is worse when moving from sitting to standing position. Pain 5/10

## 2018-12-10 ENCOUNTER — Encounter: Payer: Self-pay | Admitting: Obstetrics & Gynecology

## 2018-12-10 ENCOUNTER — Ambulatory Visit (INDEPENDENT_AMBULATORY_CARE_PROVIDER_SITE_OTHER): Payer: BLUE CROSS/BLUE SHIELD | Admitting: Obstetrics & Gynecology

## 2018-12-10 ENCOUNTER — Telehealth: Payer: Self-pay

## 2018-12-10 VITALS — BP 107/80 | HR 99 | Ht 61.0 in | Wt 189.0 lb

## 2018-12-10 DIAGNOSIS — Z9889 Other specified postprocedural states: Secondary | ICD-10-CM

## 2018-12-10 LAB — COMPLETE METABOLIC PANEL WITH GFR
AG Ratio: 2 (calc) (ref 1.0–2.5)
ALT: 52 U/L — ABNORMAL HIGH (ref 6–29)
AST: 21 U/L (ref 10–30)
Albumin: 4.5 g/dL (ref 3.6–5.1)
Alkaline phosphatase (APISO): 54 U/L (ref 31–125)
BUN: 14 mg/dL (ref 7–25)
CO2: 23 mmol/L (ref 20–32)
Calcium: 9.5 mg/dL (ref 8.6–10.2)
Chloride: 107 mmol/L (ref 98–110)
Creat: 0.64 mg/dL (ref 0.50–1.10)
GFR, Est African American: 135 mL/min/{1.73_m2} (ref >=60)
GFR, Est Non African American: 116 mL/min/{1.73_m2} (ref >=60)
Globulin: 2.3 g/dL (calc) (ref 1.9–3.7)
Glucose, Bld: 92 mg/dL (ref 65–99)
Potassium: 4.3 mmol/L (ref 3.5–5.3)
Sodium: 141 mmol/L (ref 135–146)
Total Bilirubin: 0.3 mg/dL (ref 0.2–1.2)
Total Protein: 6.8 g/dL (ref 6.1–8.1)

## 2018-12-10 NOTE — Progress Notes (Signed)
History:  33 y.o. [id number] here today for post pain after [place] with bilateral salpingectomy. Pt denies pain or bleeding at present. She reports that the cat scratched her incisions on her abd.       The following portions of the patient's history were reviewed and updated as appropriate: allergies, current medications, past family history, past medical history, past social history, past surgical history and problem list.  Review of Systems:  Pertinent items are noted in HPI.    Objective:  Physical Exam Blood pressure 107/80, pulse 99, height 5\' 1"  (1.549 m), weight 189 lb (85.7 kg), last menstrual period 08/21/2018.   CONSTITUTIONAL: Well-developed, well-nourished female in no acute distress.  HENT:  Normocephalic, atraumatic EYES: Conjunctivae and EOM are normal. No scleral icterus.  NECK: Normal range of motion SKIN: Skin is warm and dry. No rash noted. Not diaphoretic.No pallor. [place]: Alert and oriented to person, place, and time. Normal coordination.  Abd: soft, NT, NT GYN: internal exam not done today  Labs and Imaging Ct Abdomen Pelvis W Contrast  Result Date: 12/09/2018 CLINICAL DATA:  Lower abdominal pain, primarily right-sided EXAM: CT ABDOMEN AND PELVIS WITH CONTRAST TECHNIQUE: Multidetector CT imaging of the abdomen and pelvis was performed using the standard protocol following bolus administration of intravenous contrast. Oral contrast was also administered. CONTRAST:  129mL ISOVUE-300 IOPAMIDOL (ISOVUE-300) INJECTION 61% COMPARISON:  None. FINDINGS: Lower chest: Lung bases are clear.  There is a small hiatal hernia. Hepatobiliary: There is a degree of hepatic steatosis, most notably at the level of the fissure for the ligamentum teres. No focal liver lesions are evident. Gallbladder wall is not appreciably thickened. There is no biliary duct dilatation. Pancreas: There is no evident pancreatic mass or inflammatory focus. Spleen: No splenic lesions are evident.  Adrenals/Urinary Tract: Adrenals bilaterally appear normal. Kidneys bilaterally show no evident mass or hydronephrosis on either side. There is no appreciable renal or ureteral calculus on either side. Urinary bladder is midline with wall thickness within normal limits. Stomach/Bowel: There is no appreciable bowel wall or mesenteric thickening. There is no evident bowel obstruction. There is no free air or portal venous air. Vascular/Lymphatic: There is no abdominal aortic aneurysm. No vascular lesions are appreciable. There is no evident adenopathy in the abdomen or pelvis. Reproductive: Uterus is absent. There is a cyst arising in the left adnexal region containing an apparent focus of debris measuring 3.3 x 3.5 x 2.8 cm. No other pelvic masses are evident. Other: The appendix appears normal. There is no abscess or ascites in the abdomen or pelvis. There is a small ventral hernia containing only fat. Musculoskeletal: There are no blastic or lytic bone lesions. No intramuscular lesions are evident. IMPRESSION: 1. Suspect hemorrhagic left ovarian cyst measuring 3.3 x 3.5 x 2.8 cm. Pelvic ultrasound could be helpful to further assess this area. Uterus absent. 2. No evident bowel obstruction. No abscess evident in the abdomen or pelvis. Appendix appears normal. 3.  No evident renal or ureteral calculus.  No hydronephrosis. 4.  Hepatic steatosis. 5.  Small hiatal hernia.  Small ventral hernia containing only fat. Electronically Signed   By: Lowella Grip III M.D.   On: 12/09/2018 14:10    Assessment & Plan:  4 week post op check- doing well  Reviewed post op instructions   Keep abd covered to avoid injuries from cat  F/u in 2 weeks for post op internal exam or sooner prn  Christyne Mccain L. Harraway-Smith, M.D., Cherlynn June

## 2018-12-10 NOTE — Telephone Encounter (Signed)
Spoke with patient, saw GYN today.  Notified of increase liver enzyme and to follow up with PCP.  Pt stated due to 2 recent surgeries, she has been on a lot of pain medication.

## 2018-12-12 ENCOUNTER — Encounter: Payer: Self-pay | Admitting: Obstetrics & Gynecology

## 2018-12-12 MED ORDER — NORETHIN ACE-ETH ESTRAD-FE 1-20 MG-MCG(24) PO TABS: 1.0000 | ORAL_TABLET | Freq: Every day | ORAL | 11 refills | Status: DC

## 2018-12-24 ENCOUNTER — Ambulatory Visit: Payer: BLUE CROSS/BLUE SHIELD | Admitting: Obstetrics & Gynecology

## 2018-12-29 ENCOUNTER — Ambulatory Visit (INDEPENDENT_AMBULATORY_CARE_PROVIDER_SITE_OTHER): Payer: BLUE CROSS/BLUE SHIELD | Admitting: Obstetrics & Gynecology

## 2018-12-29 ENCOUNTER — Encounter: Payer: Self-pay | Admitting: Obstetrics & Gynecology

## 2018-12-29 VITALS — BP 115/68 | HR 95 | Wt 202.0 lb

## 2018-12-29 DIAGNOSIS — Z9889 Other specified postprocedural states: Secondary | ICD-10-CM

## 2018-12-29 NOTE — Progress Notes (Signed)
History:  33 y.o. [id number] here today for h er6 week post op check. Pt is s/p RATH with bilateral salpingectomy 11/11/2018. She denies pain or bleeding or other adverse sx.   She reports that she has a service dog that is being trained to assist her with her psyche issues.  Pt reports that prev pelvic pain is completely resolved. Pt has resumed most of her daily activities except intercourse.    The following portions of the patient's history were reviewed and updated as appropriate: allergies, current medications, past family history, past medical history, past social history, past surgical history and problem list.  Review of Systems:  Pertinent items are noted in HPI.    Objective:  Physical Exam Blood pressure 115/68, pulse 95, weight 202 lb (91.6 kg), last menstrual period 08/21/2018.  CONSTITUTIONAL: Well-developed, well-nourished female in no acute distress.  HENT:  Normocephalic, atraumatic EYES: Conjunctivae and EOM are normal. No scleral icterus.  NECK: Normal range of motion SKIN: Skin is warm and dry. No rash noted. Not diaphoretic.No pallor. [place]: Alert and oriented to person, place, and time. Normal coordination.  Abd: Soft, nontender and nondistended; port sites are healing well Pelvic: Normal appearing external genitalia; normal appearing vaginal mucosa. Cuff is healing well although there are sutures noted at the cuff. Normal discharge.  There is no adnexal tenderness  Labs and Imaging Ct Abdomen Pelvis W Contrast  Result Date: 12/09/2018 CLINICAL DATA:  Lower abdominal pain, primarily right-sided EXAM: CT ABDOMEN AND PELVIS WITH CONTRAST TECHNIQUE: Multidetector CT imaging of the abdomen and pelvis was performed using the standard protocol following bolus administration of intravenous contrast. Oral contrast was also administered. CONTRAST:  175mL ISOVUE-300 IOPAMIDOL (ISOVUE-300) INJECTION 61% COMPARISON:  None. FINDINGS: Lower chest: Lung bases are clear.  There is a  small hiatal hernia. Hepatobiliary: There is a degree of hepatic steatosis, most notably at the level of the fissure for the ligamentum teres. No focal liver lesions are evident. Gallbladder wall is not appreciably thickened. There is no biliary duct dilatation. Pancreas: There is no evident pancreatic mass or inflammatory focus. Spleen: No splenic lesions are evident. Adrenals/Urinary Tract: Adrenals bilaterally appear normal. Kidneys bilaterally show no evident mass or hydronephrosis on either side. There is no appreciable renal or ureteral calculus on either side. Urinary bladder is midline with wall thickness within normal limits. Stomach/Bowel: There is no appreciable bowel wall or mesenteric thickening. There is no evident bowel obstruction. There is no free air or portal venous air. Vascular/Lymphatic: There is no abdominal aortic aneurysm. No vascular lesions are appreciable. There is no evident adenopathy in the abdomen or pelvis. Reproductive: Uterus is absent. There is a cyst arising in the left adnexal region containing an apparent focus of debris measuring 3.3 x 3.5 x 2.8 cm. No other pelvic masses are evident. Other: The appendix appears normal. There is no abscess or ascites in the abdomen or pelvis. There is a small ventral hernia containing only fat. Musculoskeletal: There are no blastic or lytic bone lesions. No intramuscular lesions are evident. IMPRESSION: 1. Suspect hemorrhagic left ovarian cyst measuring 3.3 x 3.5 x 2.8 cm. Pelvic ultrasound could be helpful to further assess this area. Uterus absent. 2. No evident bowel obstruction. No abscess evident in the abdomen or pelvis. Appendix appears normal. 3.  No evident renal or ureteral calculus.  No hydronephrosis. 4.  Hepatic steatosis. 5.  Small hiatal hernia.  Small ventral hernia containing only fat. Electronically Signed   By: Lowella Grip III M.D.  On: 12/09/2018 14:10    Assessment & Plan:  6 weeks post op check doing  well  Cont gradual return to full activities  May return to sexual intercourse in 2 weeks  F/u in 3 months or sooner prn  Paradise Vensel L. Harraway-Smith, M.D., Cherlynn June

## 2018-12-30 ENCOUNTER — Other Ambulatory Visit: Payer: Self-pay | Admitting: Obstetrics & Gynecology

## 2019-02-13 IMAGING — US US TRANSVAGINAL NON-OB
1 series · 14 of 25 positions shown · non-contrast
Comparison: None.

CLINICAL DATA: Dysfunctional uterine bleeding

EXAM:
ULTRASOUND PELVIS TRANSVAGINAL
TECHNIQUE: Transvaginal ultrasound examination of the pelvis was performed
including evaluation of the uterus, ovaries, adnexal regions, and
pelvic cul-de-sac.

[Series 1: us transvaginal non-ob · 0.11mm/px · 14 of 52 slices shown]
[im 1/52]
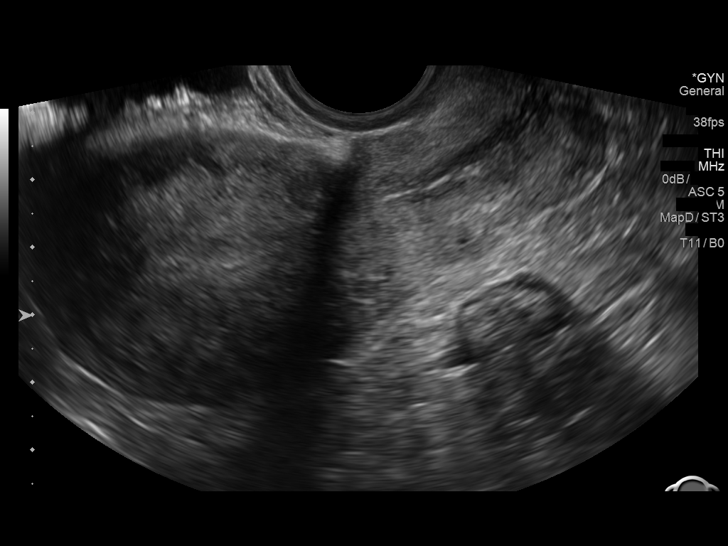
[im 5/52]
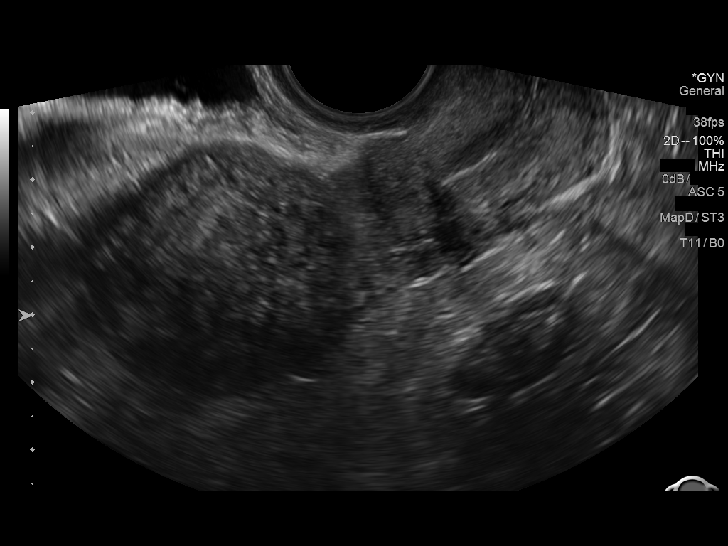
[im 9/52]
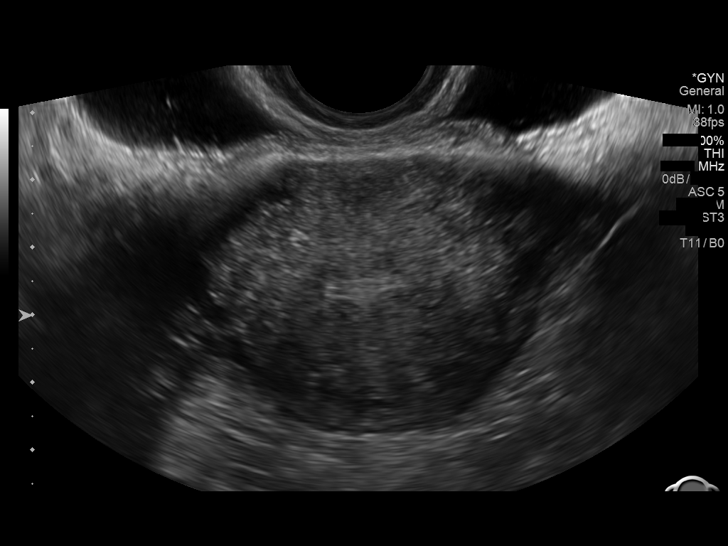
[im 13/52]
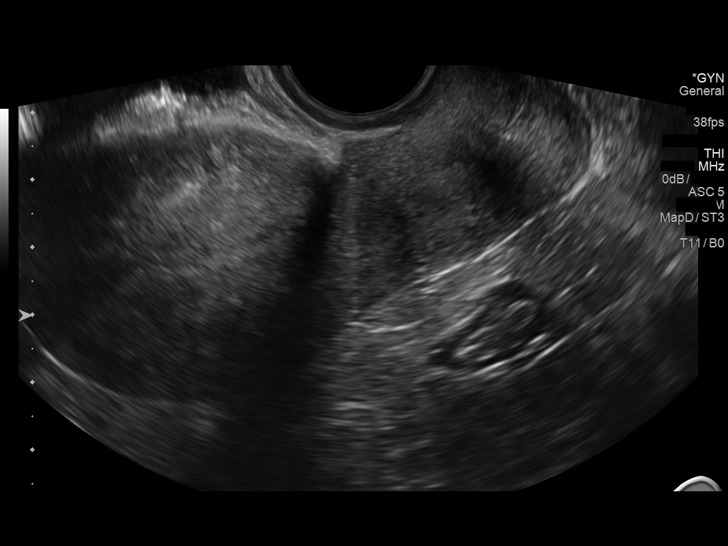
[im 18/52]
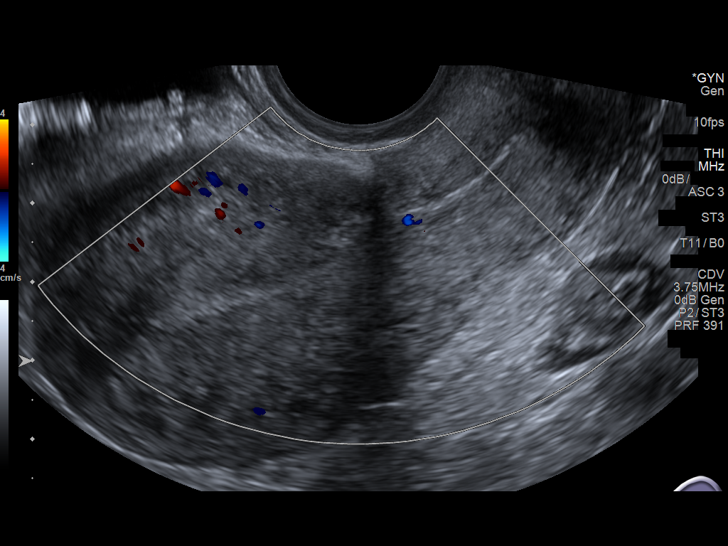
[im 20/52]
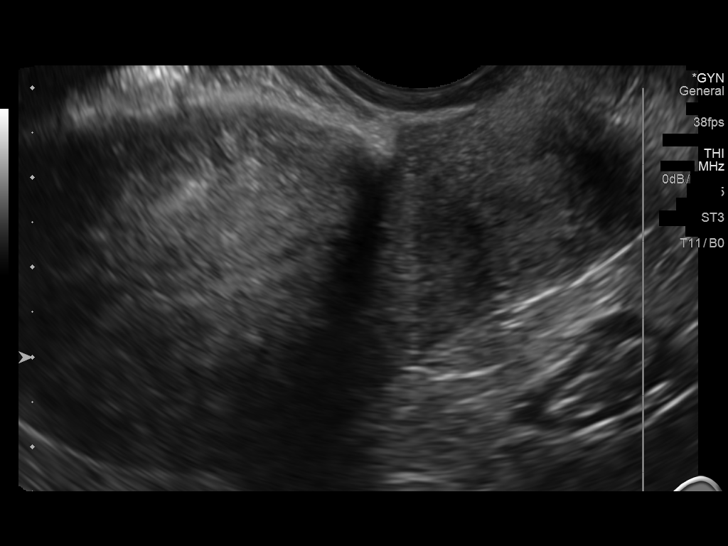
[im 24/52]
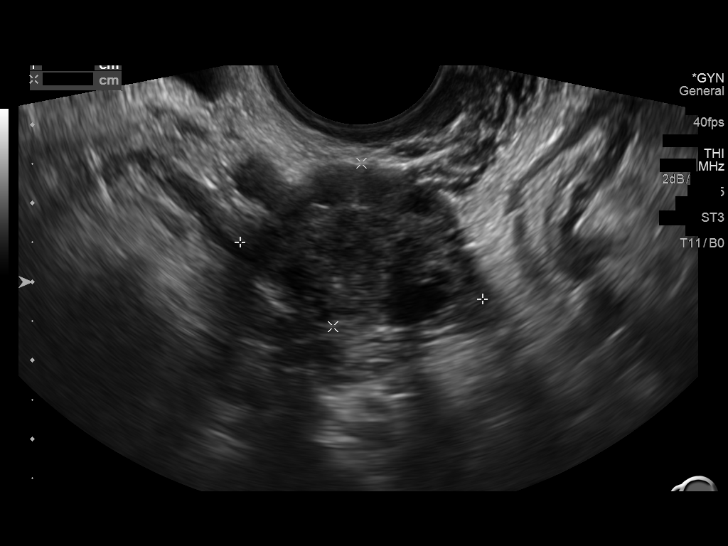
[im 28/52]
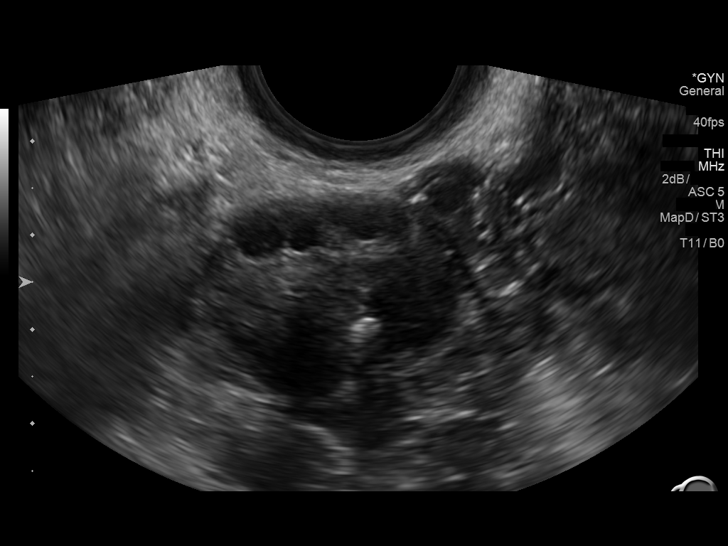
[im 32/52]
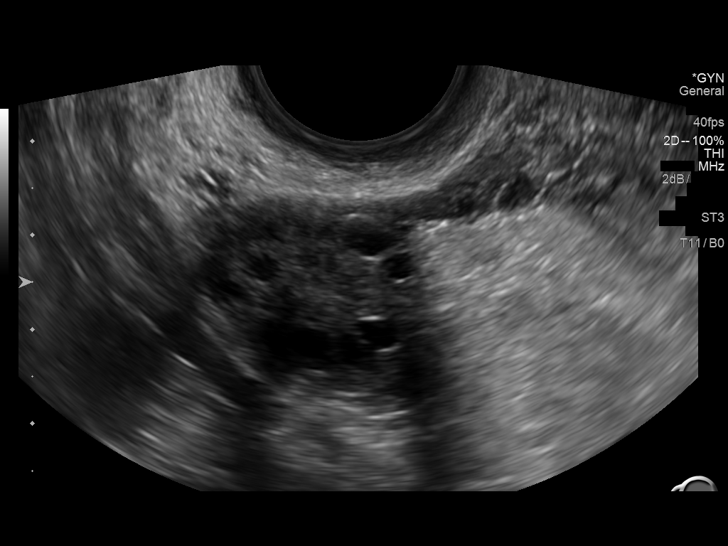
[im 35/52]
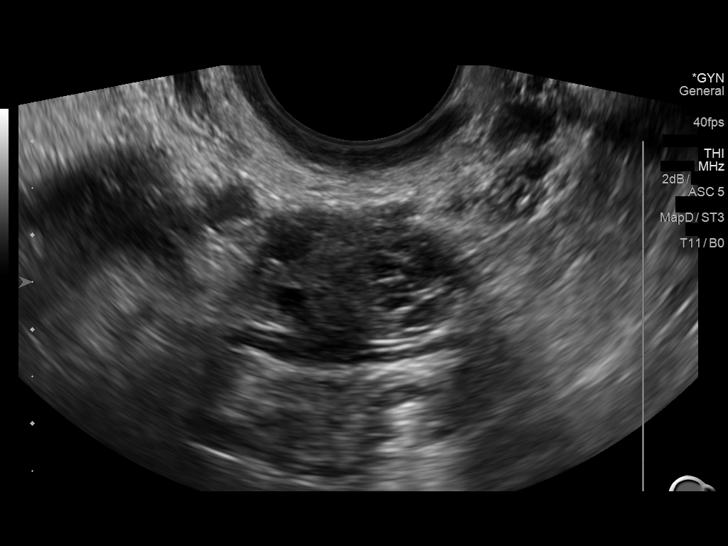
[im 39/52]
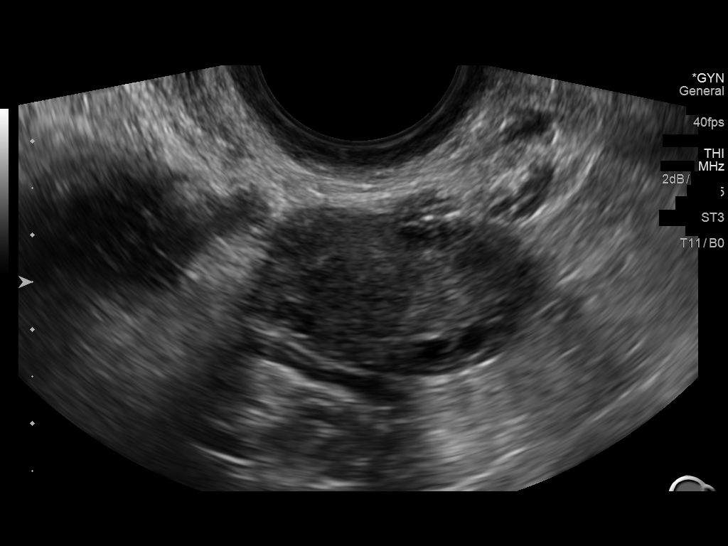
[im 43/52]
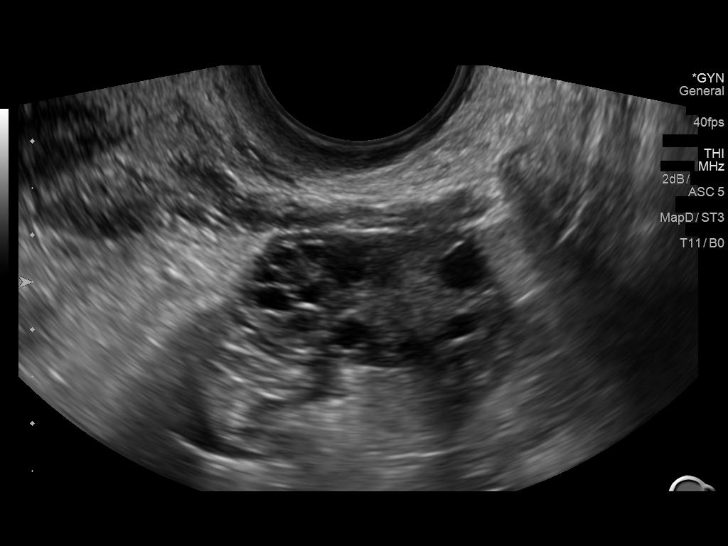
[im 47/52]
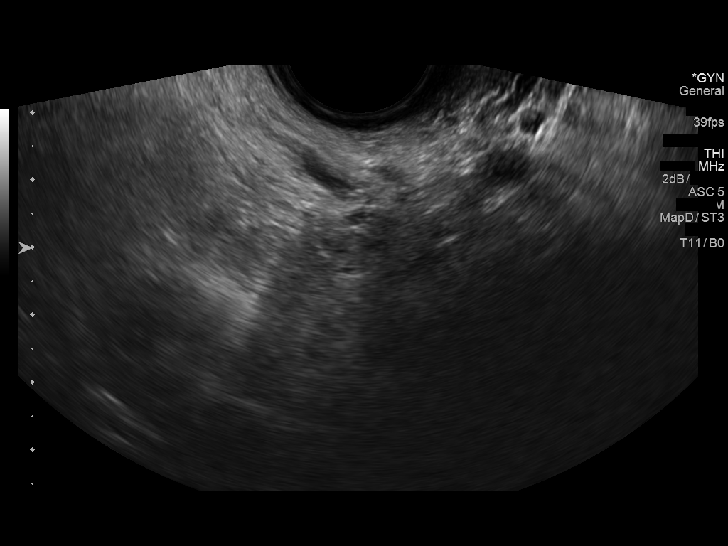
[im 52/52]
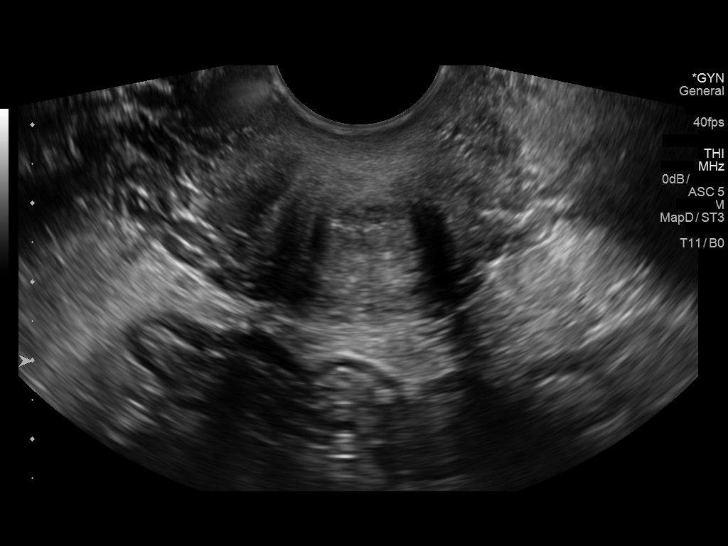

[14 of 25 positions shown; findings below may reference images not displayed]

FINDINGS: Uterus

Measurements: 8.6 x 4.2 x 4.9 cm. Heterogeneous echotexture likely
reflects adenomyosis

Endometrium

Thickness: 4 mm in thickness.  No focal abnormality visualized.

Right ovary

Measurements: 3.2 x 2.1 x 2.9 cm. Normal appearance/no adnexal mass.

Left ovary

Measurements: 2.8 x 1.6 x 3.0 cm. Normal appearance/no adnexal mass.

Other findings:  No abnormal free fluid
IMPRESSION: Heterogeneous echotexture throughout the uterus without well-defined
focal fibroid. This likely reflects adenomyosis.

## 2019-03-17 ENCOUNTER — Other Ambulatory Visit: Payer: Self-pay | Admitting: Obstetrics & Gynecology

## 2019-03-18 ENCOUNTER — Other Ambulatory Visit: Payer: Self-pay | Admitting: Obstetrics & Gynecology

## 2019-03-18 DIAGNOSIS — E559 Vitamin D deficiency, unspecified: Secondary | ICD-10-CM

## 2019-03-18 NOTE — Progress Notes (Unsigned)
Vitamin d level ordered.

## 2019-04-27 ENCOUNTER — Encounter: Payer: Self-pay | Admitting: Family Medicine

## 2019-04-27 ENCOUNTER — Ambulatory Visit (INDEPENDENT_AMBULATORY_CARE_PROVIDER_SITE_OTHER): Payer: BC Managed Care – PPO | Admitting: Family Medicine

## 2019-04-27 ENCOUNTER — Other Ambulatory Visit: Payer: Self-pay

## 2019-04-27 VITALS — BP 111/77 | HR 89 | Wt 182.0 lb

## 2019-04-27 DIAGNOSIS — L0292 Furuncle, unspecified: Secondary | ICD-10-CM

## 2019-04-27 MED ORDER — CEPHALEXIN 500 MG PO CAPS: 500.0000 mg | ORAL_CAPSULE | Freq: Three times a day (TID) | ORAL | 0 refills | Status: DC

## 2019-04-27 NOTE — Progress Notes (Signed)
    Subjective:    Patient ID: Jade Gates is a 33 y.o. female presenting with Gynecologic Exam  on 04/27/2019  HPI: Has cyst x 3 wks, popped over the weekend and noted blood and pus. It is still hurting. Was very tender. Has one sexual partner. On the left side. Was about 1 cm in diameter. Took Meloxicam, unclear if this helped.  Review of Systems  Constitutional: Negative for chills and fever.  Respiratory: Negative for shortness of breath.   Cardiovascular: Negative for chest pain.  Gastrointestinal: Negative for abdominal pain, nausea and vomiting.  Genitourinary: Positive for dysuria.  Skin: Negative for rash.      Objective:    BP 111/77   Pulse 89   Wt 182 lb (82.6 kg)   LMP 08/21/2018   BMI 34.39 kg/m  Physical Exam Constitutional:      General: She is not in acute distress.    Appearance: She is well-developed.  HENT:     Head: Normocephalic and atraumatic.  Eyes:     General: No scleral icterus. Neck:     Musculoskeletal: Neck supple.  Cardiovascular:     Rate and Rhythm: Normal rate.  Pulmonary:     Effort: Pulmonary effort is normal.  Abdominal:     Palpations: Abdomen is soft.  Genitourinary:    Comments: Superior aspect of left labia minora with firm 0.3 cm nodule. No spontaneous drainage. Skin:    General: Skin is warm and dry.  Neurological:     Mental Status: She is alert and oriented to person, place, and time.         Assessment & Plan:   Problem List Items Addressed This Visit    None    Visit Diagnoses    Boil    -  Primary   in [place], trial of Abx, warm compresses. Return if does not improve.   Relevant Medications   cephALEXin (KEFLEX) 500 MG capsule      Total face-to-face time with patient: 15 minutes. Over 50% of encounter was spent on counseling and coordination of care. Return if symptoms worsen or fail to improve.  Donnamae Jude 04/27/2019 2:47 PM

## 2019-04-27 NOTE — Patient Instructions (Signed)

## 2019-04-27 NOTE — Progress Notes (Signed)
Pt states she has inner labial cyst that popped over the weekend. Pt is still having pain in that area.

## 2019-06-18 ENCOUNTER — Other Ambulatory Visit: Payer: Self-pay | Admitting: Obstetrics & Gynecology

## 2019-08-11 ENCOUNTER — Encounter: Payer: Self-pay | Admitting: Advanced Practice Midwife

## 2019-08-11 ENCOUNTER — Other Ambulatory Visit: Payer: Self-pay

## 2019-08-11 ENCOUNTER — Ambulatory Visit (INDEPENDENT_AMBULATORY_CARE_PROVIDER_SITE_OTHER): Payer: BC Managed Care – PPO | Admitting: Advanced Practice Midwife

## 2019-08-11 VITALS — BP 106/73 | HR 87 | Ht 61.0 in | Wt 186.0 lb

## 2019-08-11 DIAGNOSIS — L732 Hidradenitis suppurativa: Secondary | ICD-10-CM | POA: Diagnosis not present

## 2019-08-11 DIAGNOSIS — N611 Abscess of the breast and nipple: Secondary | ICD-10-CM

## 2019-08-11 DIAGNOSIS — N6320 Unspecified lump in the left breast, unspecified quadrant: Secondary | ICD-10-CM

## 2019-08-11 NOTE — Progress Notes (Signed)
Pt c/o lump on left breast for the past 2 months

## 2019-08-11 NOTE — Progress Notes (Signed)
  GYNECOLOGY PROGRESS NOTE  History:  33 y.o. [id number] presents to [facility] office today for problem gyn visit. She reports a lump on her left breast that was draining recently. There is also a small pimple on her right breast.  She has hx boils in groin and axillary regions.  She denies h/a, dizziness, shortness of breath, n/v, or fever/chills.    The following portions of the patient's history were reviewed and updated as appropriate: allergies, current medications, past family history, past medical history, past social history, past surgical history and problem list. Last pap smear on 08/21/18 was normal, neg HRHPV.  Review of Systems:  Pertinent items are noted in HPI.   Objective:  Physical Exam Blood pressure 106/73, pulse 87, height 5\' 1"  (1.549 m), weight 186 lb (84.4 kg), last menstrual period 08/21/2018. VS reviewed, nursing note reviewed,  Constitutional: well developed, well nourished, no distress HEENT: normocephalic CV: normal rate Pulm/chest wall: normal effort Breast Exam:  right breast with small 0.5 cm in diameter raised papule with mild erythema at 11 o'clock in upper outer region of breast, no palpable internal breast mass, nipple changes or axillary nodes, left breast with flat erythematous non-raised lesion at 3 o'clock of outer region, without palpable mass, no nipple changes or axillary nodes Abdomen: soft Neuro: alert and oriented x 3 Skin: warm, dry Psych: affect normal Pelvic exam: Deferred  Assessment & Plan:  1. Boil, breast  - MM Digital Diagnostic Bilat; Future  2. Lump of breast, left --Previously larger per pt and draining yellow fluid, now small, mild erythema, no palpable mass on exam. -Smaller papule, no drainage noted on right breast also. --Likely abscess now post self drainage and healing, otherwise breast exam normal bilaterally.  Will evaluate further with imaging since there are lesions on both breasts. - MM Digital Diagnostic Bilat;  Future  3. Hydradenitis --pt with hx boils in groin, axillary, and breast areas   Fatima Blank, CNM 4:45 PM

## 2019-08-14 ENCOUNTER — Other Ambulatory Visit: Payer: Self-pay | Admitting: Advanced Practice Midwife

## 2019-08-14 DIAGNOSIS — N6320 Unspecified lump in the left breast, unspecified quadrant: Secondary | ICD-10-CM

## 2019-08-14 DIAGNOSIS — N611 Abscess of the breast and nipple: Secondary | ICD-10-CM

## 2019-09-04 ENCOUNTER — Other Ambulatory Visit: Payer: Self-pay

## 2019-09-04 ENCOUNTER — Ambulatory Visit
Admission: RE | Admit: 2019-09-04 | Discharge: 2019-09-04 | Disposition: A | Discharge status: Home / Self Care | Payer: BC Managed Care – PPO | Source: Ambulatory Visit | Attending: Advanced Practice Midwife | Admitting: Advanced Practice Midwife

## 2019-09-04 DIAGNOSIS — N611 Abscess of the breast and nipple: Secondary | ICD-10-CM

## 2019-09-04 DIAGNOSIS — N6320 Unspecified lump in the left breast, unspecified quadrant: Secondary | ICD-10-CM

## 2019-09-14 ENCOUNTER — Ambulatory Visit: Payer: BC Managed Care – PPO | Admitting: Obstetrics & Gynecology

## 2020-02-26 IMAGING — CT CT ABD-PELV W/ CM
2 of 4 series · 16 of 46 positions shown, 18 images · IV contrast (APPLIED)
Comparison: None.

CLINICAL DATA: Lower abdominal pain, primarily right-sided

EXAM:
CT ABDOMEN AND PELVIS WITH CONTRAST
TECHNIQUE: Multidetector CT imaging of the abdomen and pelvis was performed
using the standard protocol following bolus administration of
intravenous contrast. Oral contrast was also administered.
CONTRAST:  100mL 1ICBHW-A00 IOPAMIDOL (1ICBHW-A00) INJECTION 61%

[Series 2: axial st · axial · 0.91mm/px · z∈[-607,-187]mm · 13 of 93 slices shown, 15 images]
[im 5/93  soft-tissue]
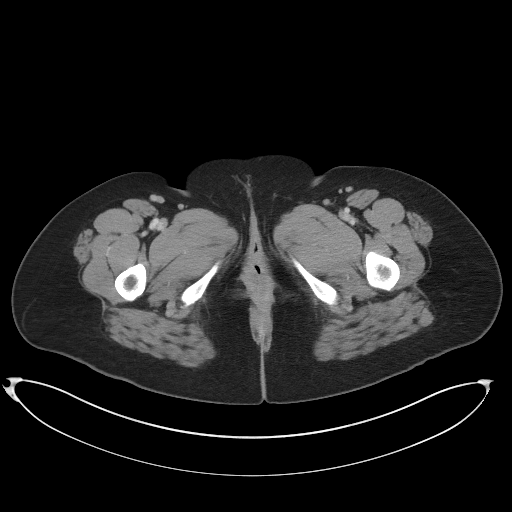
[im 5/93  bone]
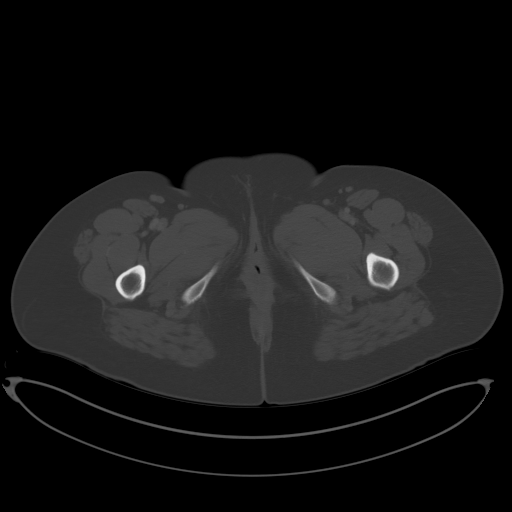
[im 13/93  soft-tissue]
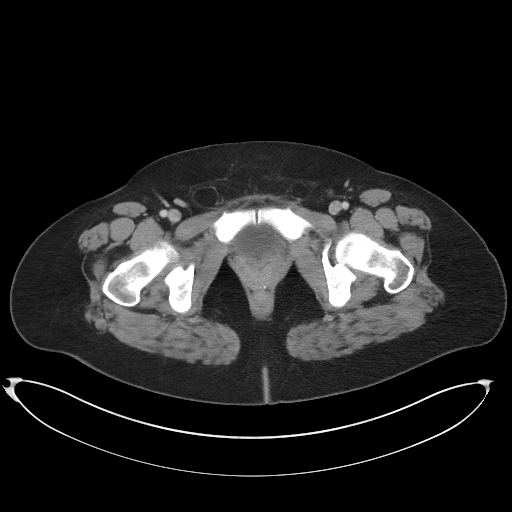
[im 21/93  soft-tissue]
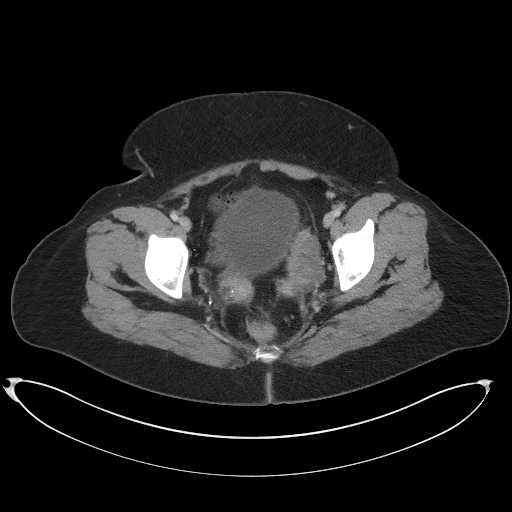
[im 25/93  soft-tissue]
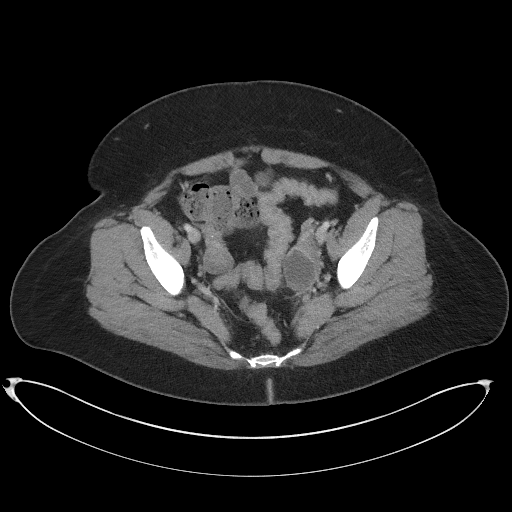
[im 33/93  soft-tissue]
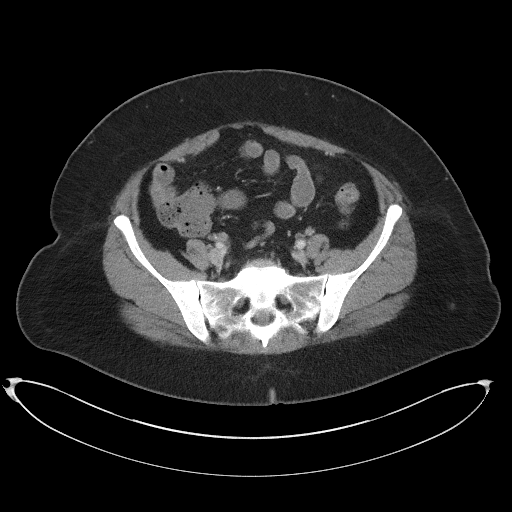
[im 41/93  soft-tissue]
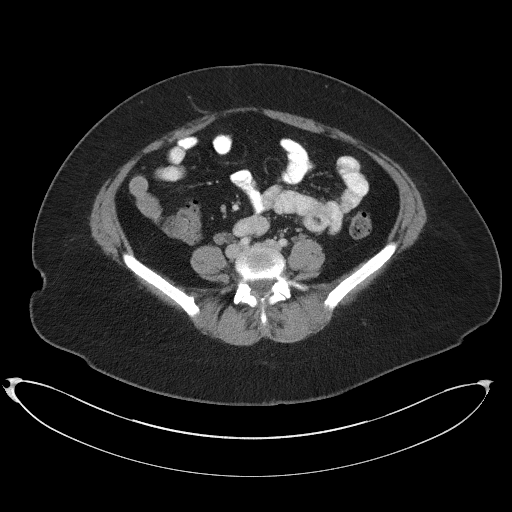
[im 49/93  soft-tissue]
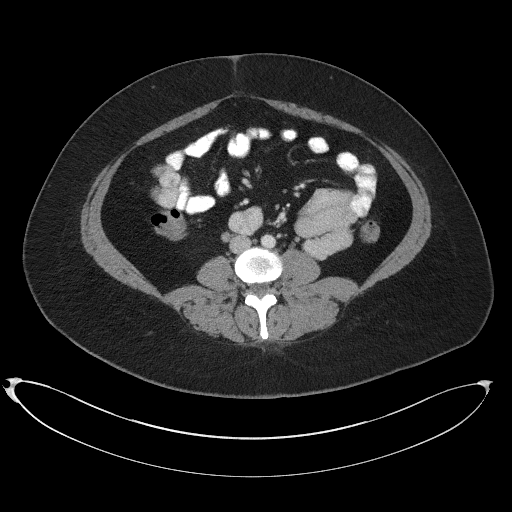
[im 53/93  soft-tissue]
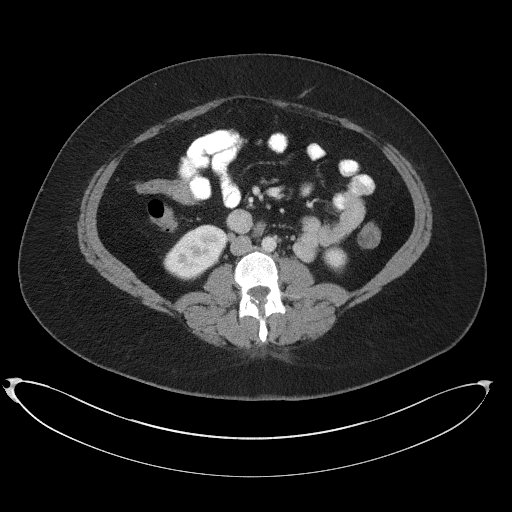
[im 61/93  soft-tissue]
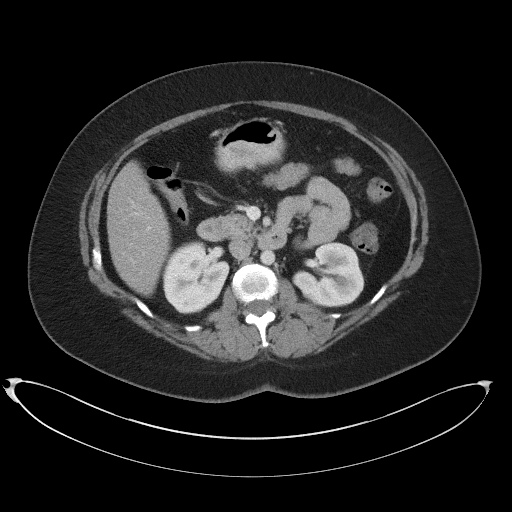
[im 61/93  bone]
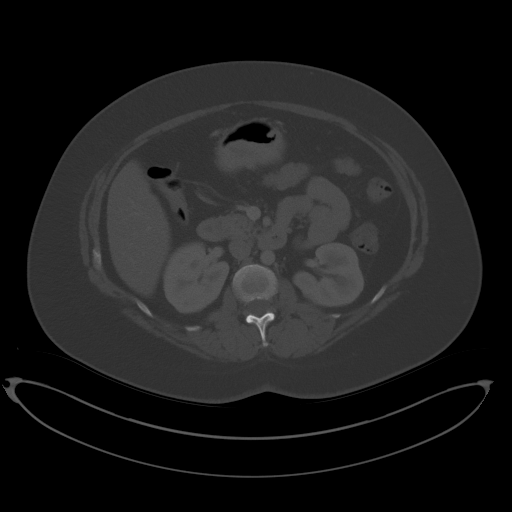
[im 69/93  soft-tissue]
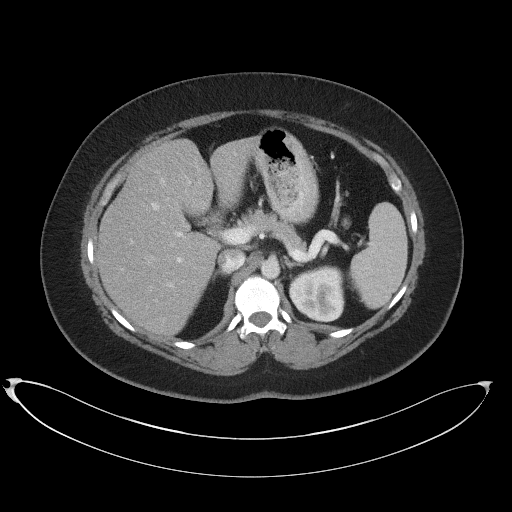
[im 73/93  soft-tissue]
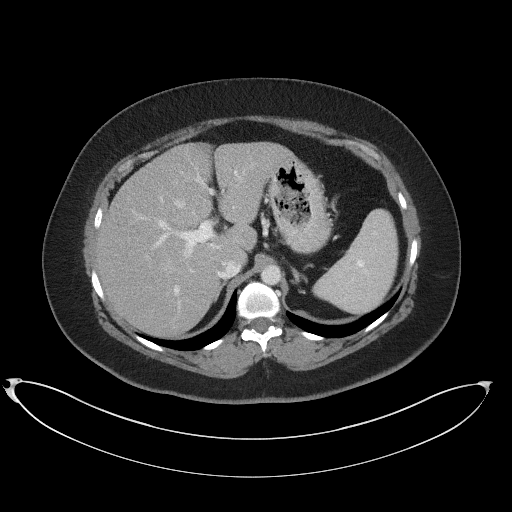
[im 81/93  soft-tissue]
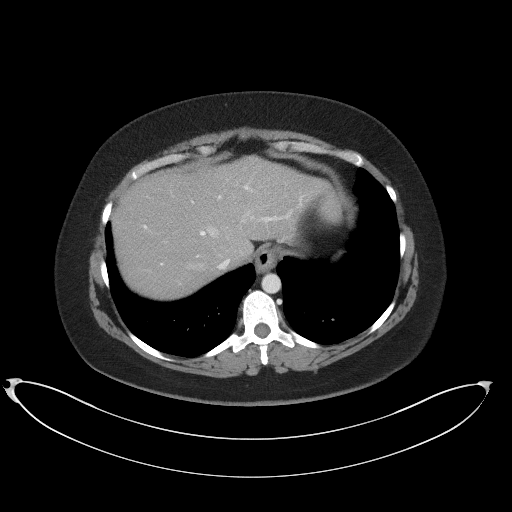
[im 89/93  soft-tissue]
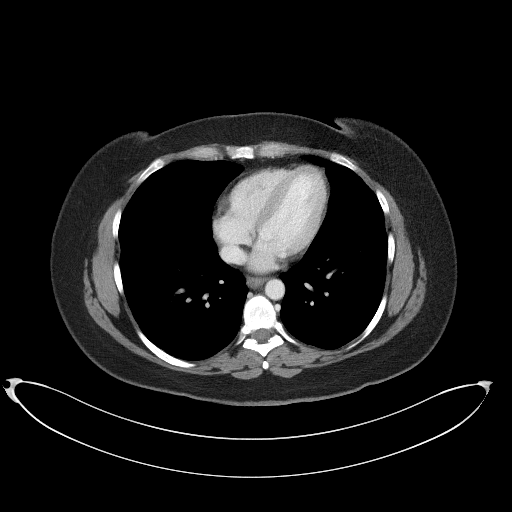

[Series 5: coronal st · coronal · 0.87mm/px · 3 of 106 slices shown]
[im 36/106  soft-tissue]
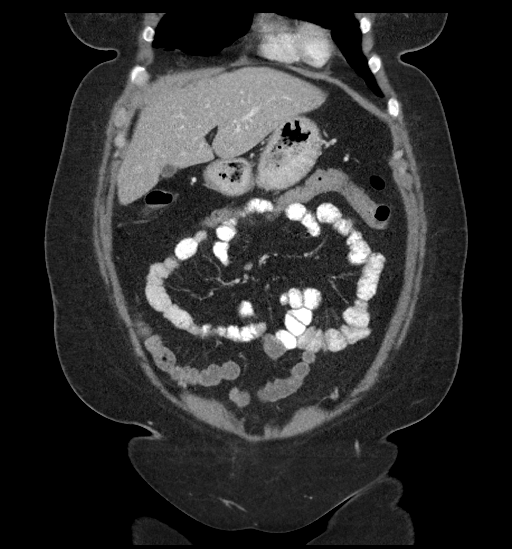
[im 47/106  soft-tissue]
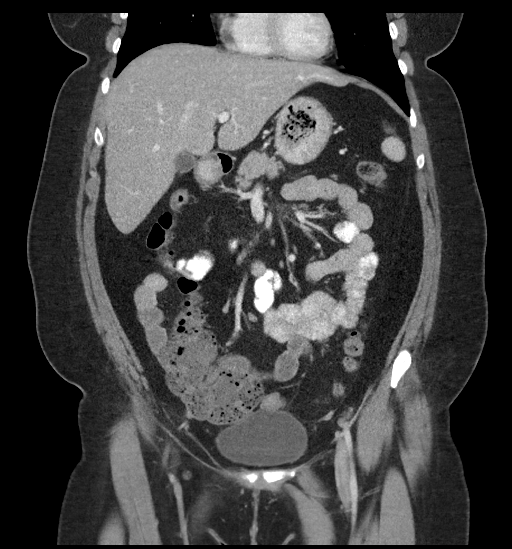
[im 59/106  soft-tissue]
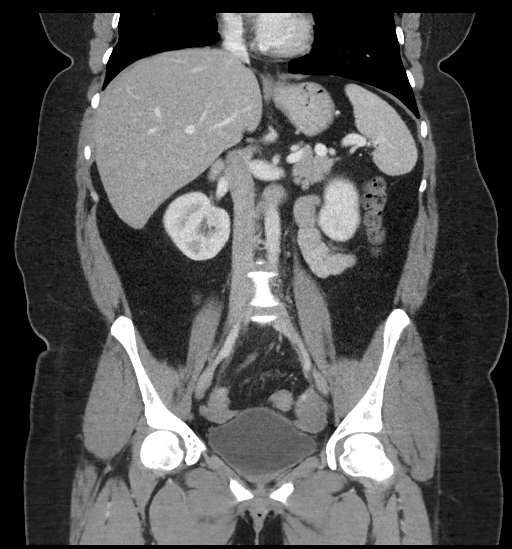

[16 of 46 positions shown; findings below may reference images not displayed]

FINDINGS: Lower chest: Lung bases are clear.  There is a small hiatal hernia.

Hepatobiliary: There is a degree of hepatic steatosis, most notably
at the level of the fissure for the ligamentum teres. No focal liver
lesions are evident. Gallbladder wall is not appreciably thickened.
There is no biliary duct dilatation.

Pancreas: There is no evident pancreatic mass or inflammatory focus.

Spleen: No splenic lesions are evident.

Adrenals/Urinary Tract: Adrenals bilaterally appear normal. Kidneys
bilaterally show no evident mass or hydronephrosis on either side.
There is no appreciable renal or ureteral calculus on either side.
Urinary bladder is midline with wall thickness within normal limits.

Stomach/Bowel: There is no appreciable bowel wall or mesenteric
thickening. There is no evident bowel obstruction. There is no free
air or portal venous air.

Vascular/Lymphatic: There is no abdominal aortic aneurysm. No
vascular lesions are appreciable. There is no evident adenopathy in
the abdomen or pelvis.

Reproductive: Uterus is absent. There is a cyst arising in the left
adnexal region containing an apparent focus of debris measuring
x 3.5 x 2.8 cm. No other pelvic masses are evident.

Other: The appendix appears normal. There is no abscess or ascites
in the abdomen or pelvis. There is a small ventral hernia containing
only fat.

Musculoskeletal: There are no blastic or lytic bone lesions. No
intramuscular lesions are evident.
IMPRESSION: 1. Suspect hemorrhagic left ovarian cyst measuring 3.3 x 3.5 x
cm. Pelvic ultrasound could be helpful to further assess this area.
Uterus absent.

2. No evident bowel obstruction. No abscess evident in the abdomen
or pelvis. Appendix appears normal.

3.  No evident renal or ureteral calculus.  No hydronephrosis.

4.  Hepatic steatosis.

5.  Small hiatal hernia.  Small ventral hernia containing only fat.

## 2020-10-18 ENCOUNTER — Ambulatory Visit (INDEPENDENT_AMBULATORY_CARE_PROVIDER_SITE_OTHER): Payer: BC Managed Care – PPO | Admitting: Certified Nurse Midwife

## 2020-10-18 ENCOUNTER — Other Ambulatory Visit (HOSPITAL_COMMUNITY)
Admission: RE | Admit: 2020-10-18 | Discharge: 2020-10-18 | Disposition: A | Discharge status: Home / Self Care | Payer: BC Managed Care – PPO | Source: Ambulatory Visit | Attending: Certified Nurse Midwife | Admitting: Certified Nurse Midwife

## 2020-10-18 ENCOUNTER — Encounter: Payer: Self-pay | Admitting: Certified Nurse Midwife

## 2020-10-18 ENCOUNTER — Other Ambulatory Visit: Payer: Self-pay

## 2020-10-18 VITALS — BP 106/69 | HR 84 | Resp 16 | Ht 61.0 in | Wt 208.0 lb

## 2020-10-18 DIAGNOSIS — N393 Stress incontinence (female) (male): Secondary | ICD-10-CM

## 2020-10-18 DIAGNOSIS — B9689 Other specified bacterial agents as the cause of diseases classified elsewhere: Secondary | ICD-10-CM | POA: Diagnosis not present

## 2020-10-18 DIAGNOSIS — N951 Menopausal and female climacteric states: Secondary | ICD-10-CM | POA: Diagnosis not present

## 2020-10-18 DIAGNOSIS — Z01419 Encounter for gynecological examination (general) (routine) without abnormal findings: Secondary | ICD-10-CM

## 2020-10-18 DIAGNOSIS — N760 Acute vaginitis: Secondary | ICD-10-CM

## 2020-10-18 DIAGNOSIS — N898 Other specified noninflammatory disorders of vagina: Secondary | ICD-10-CM

## 2020-10-18 MED ORDER — FLUCONAZOLE 150 MG PO TABS: 150.0000 mg | ORAL_TABLET | Freq: Every day | ORAL | 0 refills | Status: AC

## 2020-10-18 MED ORDER — TERCONAZOLE 0.4 % VA CREA
1.0000 | TOPICAL_CREAM | Freq: Every day | VAGINAL | 0 refills | Status: AC
Start: 2020-10-18 — End: ?

## 2020-10-18 NOTE — Progress Notes (Signed)
GYNECOLOGY ANNUAL PREVENTATIVE CARE ENCOUNTER NOTE  History:     Jade Gates is a 34 y.o. [phone] female here for a routine annual gynecologic exam.  Current complaints: vaginal irritation, hot flashes and incontinence.   Denies abnormal vaginal bleeding, pelvic pain, problems with intercourse or other gynecologic concerns.    Gynecologic History Patient's last menstrual period was 08/21/2018. Contraception: status post hysterectomy Last Pap: 08/21/2018. Results were: normal with negative HPV  Obstetric History OB History  Gravida Para Term Preterm AB Living  3 3 0 3 0 3  SAB IAB Ectopic Multiple Live Births  0 0 0 0      # Outcome Date GA Lbr Len/2nd Weight Sex Delivery Anes PTL Lv  3 Preterm           2 Preterm           1 Preterm             Past Medical History:  Diagnosis Date  . Anxiety   . Asthma   . Autism   . Complication of anesthesia   . Depression   . Depression   . Ovarian cyst   . PONV (postoperative nausea and vomiting)   . Reflux   . Vaginal Pap smear, abnormal     Past Surgical History:  Procedure Laterality Date  . ABDOMINAL HYSTERECTOMY    . ANKLE RECONSTRUCTION Left 09/19/2018  . COLPOSCOPY    . IMPLANTATION VAGAL NERVE STIMULATOR    . TONSILLECTOMY      Current Outpatient Medications on File Prior to Visit  Medication Sig Dispense Refill  . ALPRAZolam (XANAX) 2 MG tablet Take 1 tablet (2 mg total) by mouth at bedtime. 30 tablet 0  . Brexpiprazole 4 MG TABS Take 1 tablet by mouth daily. 30 tablet 0  . cariprazine (VRAYLAR) capsule Take 1.5 mg by mouth daily.     Marland Kitchen desvenlafaxine (PRISTIQ) 100 MG 24 hr tablet Take 1 tablet (100 mg total) by mouth daily. 30 tablet 0  . meloxicam (MOBIC) 15 MG tablet Take 15 mg by mouth daily.     Marland Kitchen omeprazole (PRILOSEC) 40 MG capsule Take 40 mg by mouth 2 (two) times daily.     Marland Kitchen zolpidem (AMBIEN) 10 MG tablet Take 1 tablet (10 mg total) by mouth at bedtime. 30 tablet 0   No current  facility-administered medications on file prior to visit.    Allergies  Allergen Reactions  . Ciprofloxacin Shortness Of Breath  . Vortioxetine Other (See Comments)    Other  . Atorvastatin Other (See Comments), Nausea Only and Palpitations    Headache, fatigue   . Buspar [Buspirone] Palpitations    Social History:  reports that she has never smoked. She has never used smokeless tobacco. She reports that she does not drink alcohol and does not use drugs.  Family History  Adopted: Yes    The following portions of the patient's history were reviewed and updated as appropriate: allergies, current medications, past family history, past medical history, past social history, past surgical history and problem list.  Review of Systems Pertinent items noted in HPI and remainder of comprehensive ROS otherwise negative.  Physical Exam:  BP 106/69   Pulse 84   Resp 16   Ht 5\' 1"  (1.549 m)   Wt 208 lb (94.3 kg)   LMP 08/21/2018   BMI 39.30 kg/m  CONSTITUTIONAL: Well-developed, well-nourished female in no acute distress.  HENT:  Normocephalic, atraumatic, External right and left ear  normal.  EYES: Conjunctivae and EOM are normal. Pupils are equal, round, and reactive to light.  NECK: Normal range of motion, supple, no masses.  Normal thyroid.  SKIN: Skin is warm and dry. No rash noted. Not diaphoretic. No erythema. No pallor. MUSCULOSKELETAL: Normal range of motion. No tenderness.  No cyanosis, clubbing, or edema.   NEUROLOGIC: Alert and oriented to person, place, and time. Normal reflexes, muscle tone coordination.  PSYCHIATRIC: Normal mood and affect. Normal behavior. Normal judgment and thought content. CARDIOVASCULAR: Normal heart rate noted, regular rhythm RESPIRATORY: Clear to auscultation bilaterally. Effort and breath sounds normal, no problems with respiration noted. BREASTS: Symmetric in size. No masses, tenderness, skin changes, nipple drainage, or lymphadenopathy  bilaterally. Performed in the presence of a chaperone. ABDOMEN: Soft, no distention noted.  No tenderness, rebound or guarding.  PELVIC: Normal appearing external genitalia and urethral meatus; white patches between labia majora and minora. Suggestive of yeast infection. blind swabs collected    Assessment and Plan:    1. Women's annual routine gynecological examination - Patient reports vaginal itching and patches  - Cervicovaginal ancillary only( [place])  2. Symptoms, such as flushing, sleeplessness, headache, lack of concentration, associated with the menopause - Patient reports increased symptoms of menopause over the past 1-2 months  - Reports hot flashes, headaches, lack of concentration  - TSH - FSH - Prolactin - Estradiol  3. Stress incontinence in female - Educated and discussed pelvic floor exercises and pelvic PT  - patient reports that she will do pelvic floor exercises   4. Vaginal irritation - Patient reports vaginal itching and patches between her labia, will prescribes localized and systemic medication for treatment due to location  - terconazole (TERAZOL 7) 0.4 % vaginal cream; Place 1 applicator vaginally at bedtime. Use for seven days  Dispense: 45 g; Refill: 0 - fluconazole (DIFLUCAN) 150 MG tablet; Take 1 tablet (150 mg total) by mouth daily.  Dispense: 1 tablet; Refill: 0    Routine preventative health maintenance measures emphasized. Please refer to After Visit Summary for other counseling recommendations.      Lajean Manes, [place] for [employer], [place]

## 2020-10-19 LAB — PROLACTIN: Prolactin: 9.3 ng/mL

## 2020-10-19 LAB — CERVICOVAGINAL ANCILLARY ONLY
Bacterial Vaginitis (gardnerella): POSITIVE — AB
Candida Glabrata: NEGATIVE
Candida Vaginitis: NEGATIVE
Chlamydia: NEGATIVE
Comment: NEGATIVE
Comment: NEGATIVE
Comment: NEGATIVE
Comment: NEGATIVE
Comment: NEGATIVE
Comment: NORMAL
Neisseria Gonorrhea: NEGATIVE
Trichomonas: NEGATIVE

## 2020-10-19 LAB — ESTRADIOL: Estradiol: 69 pg/mL

## 2020-10-19 LAB — TSH: TSH: 2.76 mIU/L

## 2020-10-19 LAB — FOLLICLE STIMULATING HORMONE: FSH: 6.3 m[IU]/mL

## 2020-10-19 MED ORDER — METRONIDAZOLE 500 MG PO TABS: 500.0000 mg | ORAL_TABLET | Freq: Two times a day (BID) | ORAL | 0 refills | Status: AC

## 2020-10-19 NOTE — Addendum Note (Signed)
Addended by: Lajean Manes on: 10/19/2020 02:03 PM   Modules accepted: Orders
